# Patient Record
Sex: Female | Born: 1935 | Race: White | Hispanic: No | Marital: Single | Smoking: Never smoker
Health system: Southern US, Community
[De-identification: ages and names within clinical notes are randomized; demographics above are authoritative.]

## PROBLEM LIST (undated history)

## (undated) DIAGNOSIS — I10 Essential (primary) hypertension: Secondary | ICD-10-CM

## (undated) DIAGNOSIS — E785 Hyperlipidemia, unspecified: Secondary | ICD-10-CM

## (undated) HISTORY — DX: Hyperlipidemia, unspecified: E78.5

## (undated) HISTORY — PX: OTHER SURGICAL HISTORY: SHX169

## (undated) HISTORY — PX: GALLBLADDER SURGERY: SHX652

## (undated) HISTORY — DX: Essential (primary) hypertension: I10

---

## 2011-06-06 ENCOUNTER — Ambulatory Visit: Payer: Medicare Other | Attending: Orthopedic Surgery | Admitting: Physical Therapy

## 2011-08-24 ENCOUNTER — Encounter (INDEPENDENT_AMBULATORY_CARE_PROVIDER_SITE_OTHER): Payer: Self-pay | Admitting: *Deleted

## 2012-11-26 ENCOUNTER — Other Ambulatory Visit (HOSPITAL_COMMUNITY): Payer: Self-pay

## 2012-12-31 ENCOUNTER — Encounter: Payer: Self-pay | Admitting: Cardiology

## 2013-01-02 ENCOUNTER — Encounter: Payer: Self-pay | Admitting: Cardiovascular Disease

## 2013-01-02 ENCOUNTER — Other Ambulatory Visit: Payer: Self-pay | Admitting: *Deleted

## 2013-01-02 ENCOUNTER — Encounter: Payer: Self-pay | Admitting: *Deleted

## 2013-01-02 ENCOUNTER — Ambulatory Visit: Payer: Self-pay | Admitting: Cardiovascular Disease

## 2013-01-02 ENCOUNTER — Ambulatory Visit (INDEPENDENT_AMBULATORY_CARE_PROVIDER_SITE_OTHER): Payer: Medicare Other | Admitting: Cardiovascular Disease

## 2013-01-02 VITALS — BP 125/82 | HR 100 | Ht 67.0 in | Wt 191.0 lb

## 2013-01-02 DIAGNOSIS — R011 Cardiac murmur, unspecified: Secondary | ICD-10-CM

## 2013-01-02 DIAGNOSIS — I10 Essential (primary) hypertension: Secondary | ICD-10-CM | POA: Insufficient documentation

## 2013-01-02 DIAGNOSIS — M542 Cervicalgia: Secondary | ICD-10-CM

## 2013-01-02 DIAGNOSIS — R0609 Other forms of dyspnea: Secondary | ICD-10-CM

## 2013-01-02 DIAGNOSIS — Z136 Encounter for screening for cardiovascular disorders: Secondary | ICD-10-CM

## 2013-01-02 DIAGNOSIS — R079 Chest pain, unspecified: Secondary | ICD-10-CM

## 2013-01-02 DIAGNOSIS — E785 Hyperlipidemia, unspecified: Secondary | ICD-10-CM

## 2013-01-02 NOTE — Progress Notes (Signed)
Patient ID: Nina Lowery, female   DOB: 1935/12/03, 77 y.o.   MRN: [id number]       CARDIOLOGY CONSULT NOTE  Patient ID: Nina Lowery MRN: [id number] DOB/AGE: 09/21/1935 77 y.o.  Admit date: (Not on file) Primary Physician Josue Hector, MD  Reason for Consultation: doe, murmur  HPI: [name] is a 77 year old woman with a PMH significant for HTN, who has been experiencing dyspnea on exertion, especially when walking uphill. This has been going on for 1-2 years.  It can occur when walking from the parking lot of a store to the front door. She denies chest pain, but has an associated aching sensation in her neck and across both shoulders. She denies associated diaphoresis and nausea, along with lightheadedness, dizziness, and syncope. It lasts 15-20 minutes and if she sits down and rests, it subsides. She's never taken any medication for it. She has ankle swelling and takes HCTZ, but she says it's never been bothersome. It may occur 1-2 times a month.  She also has a h/o a cardiac murmur. She also has a h/o anemia and was found to have heme-positive stool. A recent Hgb was 13.3.  She denies ever having had an echocardiogram or having seen a cardiologist.   SocHx: neither drinks nor smokes. Quit smoking 50 years ago.  FamHx: mother died of an MI at age 72. Brother died of an MI in his late 72's, and a sister died of an MI in her 10's. Father died of an MI at 34.    Allergies  Allergen Reactions  . Shrimp [Shellfish Allergy]     Current Outpatient Prescriptions  Medication Sig Dispense Refill  . acetaminophen (TYLENOL) 500 MG tablet Take 500 mg by mouth every 6 (six) hours as needed for pain.      . busPIRone (BUSPAR) 15 MG tablet Take 15 mg by mouth 2 (two) times daily.      . celecoxib (CELEBREX) 200 MG capsule Take 200 mg by mouth 2 (two) times daily as needed for pain.      . cholecalciferol (VITAMIN D) 400 UNITS TABS tablet Take 400 Units by mouth daily.       . ferrous sulfate 325 (65 FE) MG tablet Take 325 mg by mouth daily with breakfast.      . fosinopril (MONOPRIL) 40 MG tablet Take 40 mg by mouth daily.      . hydrochlorothiazide (HYDRODIURIL) 25 MG tablet Take 25 mg by mouth daily.      Marland Kitchen HYDROcodone-acetaminophen (NORCO/VICODIN) 5-325 MG per tablet Take 1 tablet by mouth every 6 (six) hours as needed for pain.      Marland Kitchen imipramine (TOFRANIL) 50 MG tablet Take 50 mg by mouth at bedtime.       No current facility-administered medications for this visit.    PMH: HTN  Past Surgical History  Procedure Laterality Date  . Hysterectomy --- unknown    . Gallbladder surgery      History   Social History  . Marital Status: Single    Spouse Name: N/A    Number of Children: N/A  . Years of Education: N/A   Occupational History  . Not on file.   Social History Main Topics  . Smoking status: Never Smoker   . Smokeless tobacco: Not on file  . Alcohol Use: Not on file  . Drug Use: Not on file  . Sexual Activity: Not on file   Other Topics Concern  . Not on file  Social History Narrative  . No narrative on file     No family history on file.   Prior to Admission medications   Medication Sig Start Date End Date Taking? Authorizing Provider  acetaminophen (TYLENOL) 500 MG tablet Take 500 mg by mouth every 6 (six) hours as needed for pain.   Yes Historical Provider, MD  busPIRone (BUSPAR) 15 MG tablet Take 15 mg by mouth 2 (two) times daily.   Yes Historical Provider, MD  celecoxib (CELEBREX) 200 MG capsule Take 200 mg by mouth 2 (two) times daily as needed for pain.   Yes Historical Provider, MD  cholecalciferol (VITAMIN D) 400 UNITS TABS tablet Take 400 Units by mouth daily.   Yes Historical Provider, MD  ferrous sulfate 325 (65 FE) MG tablet Take 325 mg by mouth daily with breakfast.   Yes Historical Provider, MD  fosinopril (MONOPRIL) 40 MG tablet Take 40 mg by mouth daily.   Yes Historical Provider, MD  hydrochlorothiazide  (HYDRODIURIL) 25 MG tablet Take 25 mg by mouth daily.   Yes Historical Provider, MD  HYDROcodone-acetaminophen (NORCO/VICODIN) 5-325 MG per tablet Take 1 tablet by mouth every 6 (six) hours as needed for pain.   Yes Historical Provider, MD  imipramine (TOFRANIL) 50 MG tablet Take 50 mg by mouth at bedtime.   Yes Historical Provider, MD     Review of systems complete and found to be negative unless listed above in HPI     Physical exam Blood pressure 125/82, pulse 100, height 5\' 7"  (1.702 m), weight 191 lb (86.637 kg). General: NAD Neck: No JVD, no thyromegaly or thyroid nodule.  Lungs: Faint dry intermittent crackles bilaterally at bases with normal respiratory effort. CV: Nondisplaced PMI.  Heart regular S1/slightly diminished S2, no S3/S4, III-IV/VI harsh systolic murmur best heard at RUSB.  No peripheral edema.  No carotid bruit.  Normal pedal pulses.  Abdomen: Soft, nontender, no hepatosplenomegaly, no distention.  Skin: Intact without lesions or rashes.  Neurologic: Alert and oriented x 3.  Psych: Normal affect. Extremities: No clubbing or cyanosis.  HEENT: Normal.   Labs:   No results found for this basename: WBC, HGB, HCT, MCV, PLT   No results found for this basename: NA, K, CL, CO2, BUN, CREATININE, CALCIUM, LABALBU, PROT, BILITOT, ALKPHOS, ALT, AST, GLUCOSE,  in the last 168 hours No results found for this basename: CKTOTAL, CKMB, CKMBINDEX, TROPONINI    No results found for this basename: CHOL   No results found for this basename: HDL   No results found for this basename: LDLCALC   No results found for this basename: TRIG   No results found for this basename: CHOLHDL   No results found for this basename: LDLDIRECT       EKG: Sinus rhythm, rate 100 bpm, LVH with repolarization changes  Studies: No results found.  ASSESSMENT: 1. Dyspnea on exertion with associated neck and shoulder aching 2. Cardiac murmur 3. Strong family h/o CAD 4. ECG with  LVH  PLAN: Given the length of each episode which occurs with exertion, in the context of a significant family h/o CAD, her symptoms are concerning for occult ischemia (although a stenotic valve could also be contributing). I will obtain a Lexiscan Cardiolite stress test. To further evaluate her murmur, I will obtain an echocardiogram to assess for valvular pathology (aortic stenosis in particular). I will also evaluate for structural heart disease (e.g. LVH) and both systolic and diastolic function. Her HTN is controlled on current therapy. She tells  me her lipids are normal. No medication adjustments at this time.  Dispo: will f/u in 2-3 weeks after testing is complete.   Signed: Prentice Docker, M.D., F.A.C.C.  01/02/2013, 9:23 AM

## 2013-01-02 NOTE — Patient Instructions (Signed)
Your physician has requested that you have an echocardiogram. Echocardiography is a painless test that uses sound waves to create images of your heart. It provides your doctor with information about the size and shape of your heart and how well your heart's chambers and valves are working. This procedure takes approximately one hour. There are no restrictions for this procedure. Your physician has requested that you have a lexiscan myoview. For further information please visit [email or web address] Please follow instruction sheet, as given. Office will contact with results via phone or letter.    Continue all current medications. Follow up in  2-4 weeks

## 2013-01-14 ENCOUNTER — Other Ambulatory Visit: Payer: Self-pay | Admitting: *Deleted

## 2013-01-14 DIAGNOSIS — R079 Chest pain, unspecified: Secondary | ICD-10-CM

## 2013-01-15 ENCOUNTER — Other Ambulatory Visit: Payer: Medicare Other

## 2013-01-23 ENCOUNTER — Other Ambulatory Visit: Payer: Medicare Other

## 2013-01-30 ENCOUNTER — Other Ambulatory Visit: Payer: Medicare Other

## 2013-02-13 ENCOUNTER — Other Ambulatory Visit: Payer: Medicare Other

## 2013-03-06 ENCOUNTER — Encounter: Payer: Self-pay | Admitting: Cardiovascular Disease

## 2013-04-01 ENCOUNTER — Telehealth: Payer: Self-pay | Admitting: *Deleted

## 2013-04-01 NOTE — Telephone Encounter (Signed)
Patient last seen on 01/02/2013 by Dr. Purvis Sheffield.  Echo & Lexiscan Cardiolite test were ordered.  These test have not been completed yet.    Echo - 10/15 - cx due to car trouble Echo - 10/23 - cx due to woke up with flu symptoms Echo - 10/30 - cx due to pt has to have someone bring her  Echo - 11/13 - cx due to pt being sick & will call back when get daughters schedule   Lexiscan Cardiolite - 10/16 - cx - no reason

## 2013-04-01 NOTE — Telephone Encounter (Signed)
Left message to return call 

## 2013-04-08 ENCOUNTER — Encounter: Payer: Self-pay | Admitting: *Deleted

## 2013-04-08 NOTE — Telephone Encounter (Signed)
No reply from patient yet, will mail letter today.

## 2013-04-28 ENCOUNTER — Telehealth: Payer: Self-pay | Admitting: *Deleted

## 2013-04-28 NOTE — Telephone Encounter (Signed)
Message copied by Lesle ChrisHILL, ANGELA G on Mon Apr 28, 2013 10:51 AM ------      Message from: Lesle ChrisHILL, ANGELA G      Created: Tue Apr 08, 2013 11:13 AM      Regarding: ko fu       Mailed no show letter today, will give 2 weeks to respond.  Today - 04/08/2013  ------

## 2013-04-28 NOTE — Telephone Encounter (Signed)
No reply from patient to date.

## 2013-07-17 ENCOUNTER — Telehealth: Payer: Self-pay | Admitting: Cardiovascular Disease

## 2013-07-17 NOTE — Telephone Encounter (Signed)
Left message to have patient call to make an appointment with us.  Dr Joyce CopaNyland's office faxed a request for us to see patient.

## 2015-08-09 ENCOUNTER — Encounter (INDEPENDENT_AMBULATORY_CARE_PROVIDER_SITE_OTHER): Payer: Self-pay | Admitting: Ophthalmology

## 2015-08-23 ENCOUNTER — Encounter (INDEPENDENT_AMBULATORY_CARE_PROVIDER_SITE_OTHER): Payer: Medicare Other | Admitting: Ophthalmology

## 2015-08-23 DIAGNOSIS — H43813 Vitreous degeneration, bilateral: Secondary | ICD-10-CM | POA: Diagnosis not present

## 2015-08-23 DIAGNOSIS — H35033 Hypertensive retinopathy, bilateral: Secondary | ICD-10-CM

## 2015-08-23 DIAGNOSIS — H353132 Nonexudative age-related macular degeneration, bilateral, intermediate dry stage: Secondary | ICD-10-CM | POA: Diagnosis not present

## 2015-08-23 DIAGNOSIS — H2513 Age-related nuclear cataract, bilateral: Secondary | ICD-10-CM | POA: Diagnosis not present

## 2015-08-23 DIAGNOSIS — I10 Essential (primary) hypertension: Secondary | ICD-10-CM | POA: Diagnosis not present

## 2017-12-13 ENCOUNTER — Telehealth: Payer: Self-pay

## 2017-12-13 NOTE — Telephone Encounter (Signed)
Notes on file, referral sent to scheduling.  

## 2018-01-28 ENCOUNTER — Ambulatory Visit: Payer: Medicare Other | Admitting: Cardiovascular Disease

## 2018-01-31 ENCOUNTER — Ambulatory Visit: Payer: Medicare Other | Admitting: Cardiovascular Disease

## 2018-02-07 ENCOUNTER — Emergency Department (HOSPITAL_COMMUNITY): Payer: Medicare Other

## 2018-02-07 ENCOUNTER — Inpatient Hospital Stay (HOSPITAL_COMMUNITY)
Admission: EM | Admit: 2018-02-07 | Discharge: 2018-03-03 | DRG: 871 | Disposition: E | Payer: Medicare Other | Attending: Internal Medicine | Admitting: Internal Medicine

## 2018-02-07 ENCOUNTER — Encounter (HOSPITAL_COMMUNITY): Payer: Self-pay

## 2018-02-07 ENCOUNTER — Other Ambulatory Visit: Payer: Self-pay

## 2018-02-07 DIAGNOSIS — I110 Hypertensive heart disease with heart failure: Secondary | ICD-10-CM | POA: Diagnosis present

## 2018-02-07 DIAGNOSIS — E8729 Other acidosis: Secondary | ICD-10-CM | POA: Diagnosis present

## 2018-02-07 DIAGNOSIS — R230 Cyanosis: Secondary | ICD-10-CM | POA: Diagnosis present

## 2018-02-07 DIAGNOSIS — D72829 Elevated white blood cell count, unspecified: Secondary | ICD-10-CM | POA: Diagnosis not present

## 2018-02-07 DIAGNOSIS — Z515 Encounter for palliative care: Secondary | ICD-10-CM | POA: Diagnosis not present

## 2018-02-07 DIAGNOSIS — E872 Acidosis, unspecified: Secondary | ICD-10-CM

## 2018-02-07 DIAGNOSIS — R7989 Other specified abnormal findings of blood chemistry: Secondary | ICD-10-CM | POA: Diagnosis not present

## 2018-02-07 DIAGNOSIS — M79605 Pain in left leg: Secondary | ICD-10-CM

## 2018-02-07 DIAGNOSIS — I4891 Unspecified atrial fibrillation: Secondary | ICD-10-CM | POA: Diagnosis not present

## 2018-02-07 DIAGNOSIS — D65 Disseminated intravascular coagulation [defibrination syndrome]: Secondary | ICD-10-CM | POA: Diagnosis present

## 2018-02-07 DIAGNOSIS — R0609 Other forms of dyspnea: Secondary | ICD-10-CM | POA: Diagnosis not present

## 2018-02-07 DIAGNOSIS — A419 Sepsis, unspecified organism: Principal | ICD-10-CM | POA: Diagnosis present

## 2018-02-07 DIAGNOSIS — R6520 Severe sepsis without septic shock: Secondary | ICD-10-CM

## 2018-02-07 DIAGNOSIS — I5033 Acute on chronic diastolic (congestive) heart failure: Secondary | ICD-10-CM | POA: Diagnosis present

## 2018-02-07 DIAGNOSIS — R740 Nonspecific elevation of levels of transaminase and lactic acid dehydrogenase [LDH]: Secondary | ICD-10-CM

## 2018-02-07 DIAGNOSIS — G9341 Metabolic encephalopathy: Secondary | ICD-10-CM

## 2018-02-07 DIAGNOSIS — N179 Acute kidney failure, unspecified: Secondary | ICD-10-CM | POA: Diagnosis present

## 2018-02-07 DIAGNOSIS — J9601 Acute respiratory failure with hypoxia: Secondary | ICD-10-CM | POA: Diagnosis present

## 2018-02-07 DIAGNOSIS — I159 Secondary hypertension, unspecified: Secondary | ICD-10-CM

## 2018-02-07 DIAGNOSIS — E869 Volume depletion, unspecified: Secondary | ICD-10-CM | POA: Diagnosis present

## 2018-02-07 DIAGNOSIS — L8990 Pressure ulcer of unspecified site, unspecified stage: Secondary | ICD-10-CM

## 2018-02-07 DIAGNOSIS — Z91013 Allergy to seafood: Secondary | ICD-10-CM

## 2018-02-07 DIAGNOSIS — I10 Essential (primary) hypertension: Secondary | ICD-10-CM | POA: Diagnosis present

## 2018-02-07 DIAGNOSIS — M79606 Pain in leg, unspecified: Secondary | ICD-10-CM

## 2018-02-07 DIAGNOSIS — R7401 Elevation of levels of liver transaminase levels: Secondary | ICD-10-CM

## 2018-02-07 DIAGNOSIS — J690 Pneumonitis due to inhalation of food and vomit: Secondary | ICD-10-CM | POA: Diagnosis present

## 2018-02-07 DIAGNOSIS — R0600 Dyspnea, unspecified: Secondary | ICD-10-CM

## 2018-02-07 DIAGNOSIS — R6521 Severe sepsis with septic shock: Secondary | ICD-10-CM | POA: Diagnosis present

## 2018-02-07 DIAGNOSIS — E871 Hypo-osmolality and hyponatremia: Secondary | ICD-10-CM | POA: Diagnosis present

## 2018-02-07 DIAGNOSIS — E785 Hyperlipidemia, unspecified: Secondary | ICD-10-CM | POA: Diagnosis present

## 2018-02-07 DIAGNOSIS — I350 Nonrheumatic aortic (valve) stenosis: Secondary | ICD-10-CM | POA: Diagnosis present

## 2018-02-07 DIAGNOSIS — R748 Abnormal levels of other serum enzymes: Secondary | ICD-10-CM | POA: Diagnosis present

## 2018-02-07 DIAGNOSIS — I4819 Other persistent atrial fibrillation: Secondary | ICD-10-CM | POA: Diagnosis present

## 2018-02-07 DIAGNOSIS — Z66 Do not resuscitate: Secondary | ICD-10-CM | POA: Diagnosis not present

## 2018-02-07 DIAGNOSIS — Z7189 Other specified counseling: Secondary | ICD-10-CM

## 2018-02-07 DIAGNOSIS — K7200 Acute and subacute hepatic failure without coma: Secondary | ICD-10-CM | POA: Diagnosis present

## 2018-02-07 DIAGNOSIS — M79604 Pain in right leg: Secondary | ICD-10-CM

## 2018-02-07 DIAGNOSIS — R4182 Altered mental status, unspecified: Secondary | ICD-10-CM | POA: Diagnosis present

## 2018-02-07 LAB — COMPREHENSIVE METABOLIC PANEL
ALBUMIN: 3 g/dL — AB (ref 3.5–5.0)
ALT: 74 U/L — AB (ref 0–44)
AST: 133 U/L — AB (ref 15–41)
Alkaline Phosphatase: 105 U/L (ref 38–126)
Anion gap: >20 — ABNORMAL HIGH (ref 5–15)
BILIRUBIN TOTAL: 4.5 mg/dL — AB (ref 0.3–1.2)
BUN: 69 mg/dL — ABNORMAL HIGH (ref 8–23)
CO2: 14 mmol/L — ABNORMAL LOW (ref 22–32)
CREATININE: 2.92 mg/dL — AB (ref 0.44–1.00)
Calcium: 8.5 mg/dL — ABNORMAL LOW (ref 8.9–10.3)
Chloride: 93 mmol/L — ABNORMAL LOW (ref 98–111)
GFR calc Af Amer: 16 mL/min — ABNORMAL LOW (ref >60)
GFR, EST NON AFRICAN AMERICAN: 14 mL/min — AB (ref >60)
GLUCOSE: 101 mg/dL — AB (ref 70–99)
POTASSIUM: 4.4 mmol/L (ref 3.5–5.1)
Sodium: 128 mmol/L — ABNORMAL LOW (ref 135–145)
TOTAL PROTEIN: 6.7 g/dL (ref 6.5–8.1)

## 2018-02-07 LAB — CBC WITH DIFFERENTIAL/PLATELET
Abs Immature Granulocytes: 0.07 10*3/uL (ref 0.00–0.07)
BASOS ABS: 0 10*3/uL (ref 0.0–0.1)
BASOS PCT: 0 %
EOS PCT: 0 %
Eosinophils Absolute: 0 10*3/uL (ref 0.0–0.5)
HCT: 41.4 % (ref 36.0–46.0)
Hemoglobin: 12.5 g/dL (ref 12.0–15.0)
IMMATURE GRANULOCYTES: 1 %
LYMPHS PCT: 7 %
Lymphs Abs: 1 10*3/uL (ref 0.7–4.0)
MCH: 25.8 pg — ABNORMAL LOW (ref 26.0–34.0)
MCHC: 30.2 g/dL (ref 30.0–36.0)
MCV: 85.4 fL (ref 80.0–100.0)
MONO ABS: 1.4 10*3/uL — AB (ref 0.1–1.0)
MONOS PCT: 10 %
NEUTROS ABS: 11.8 10*3/uL — AB (ref 1.7–7.7)
Neutrophils Relative %: 82 %
PLATELETS: 340 10*3/uL (ref 150–400)
RBC: 4.85 MIL/uL (ref 3.87–5.11)
RDW: 17 % — AB (ref 11.5–15.5)
WBC: 14.3 10*3/uL — ABNORMAL HIGH (ref 4.0–10.5)
nRBC: 0.2 % (ref 0.0–0.2)

## 2018-02-07 LAB — URINALYSIS, ROUTINE W REFLEX MICROSCOPIC
BILIRUBIN URINE: NEGATIVE
GLUCOSE, UA: NEGATIVE mg/dL
Hgb urine dipstick: NEGATIVE
Ketones, ur: NEGATIVE mg/dL
LEUKOCYTES UA: NEGATIVE
NITRITE: NEGATIVE
PH: 5 (ref 5.0–8.0)
Protein, ur: 30 mg/dL — AB
SPECIFIC GRAVITY, URINE: 1.019 (ref 1.005–1.030)

## 2018-02-07 LAB — PROCALCITONIN: PROCALCITONIN: 0.35 ng/mL

## 2018-02-07 LAB — LACTIC ACID, PLASMA: Lactic Acid, Venous: 2.3 mmol/L (ref 0.5–1.9)

## 2018-02-07 LAB — INFLUENZA PANEL BY PCR (TYPE A & B)
Influenza A By PCR: NEGATIVE
Influenza B By PCR: NEGATIVE

## 2018-02-07 LAB — I-STAT CG4 LACTIC ACID, ED: LACTIC ACID, VENOUS: 8.97 mmol/L — AB (ref 0.5–1.9)

## 2018-02-07 LAB — MAGNESIUM: Magnesium: 2.5 mg/dL — ABNORMAL HIGH (ref 1.7–2.4)

## 2018-02-07 LAB — PROTIME-INR
INR: 2.63
PROTHROMBIN TIME: 27.7 s — AB (ref 11.4–15.2)

## 2018-02-07 LAB — MRSA PCR SCREENING: MRSA by PCR: NEGATIVE

## 2018-02-07 LAB — BRAIN NATRIURETIC PEPTIDE: B Natriuretic Peptide: 288 pg/mL — ABNORMAL HIGH (ref 0.0–100.0)

## 2018-02-07 LAB — TSH: TSH: 1.044 u[IU]/mL (ref 0.350–4.500)

## 2018-02-07 LAB — TROPONIN I: Troponin I: 0.05 ng/mL (ref <0.03)

## 2018-02-07 MED ORDER — ENOXAPARIN SODIUM 40 MG/0.4ML ~~LOC~~ SOLN: 40.0000 mg | SUBCUTANEOUS | Status: DC

## 2018-02-07 MED ORDER — SODIUM CHLORIDE 0.9 % IV BOLUS (SEPSIS)
1000.0000 mL | Freq: Once | INTRAVENOUS | Status: AC
  Administered 2018-02-07: 1000 mL via INTRAVENOUS

## 2018-02-07 MED ORDER — VANCOMYCIN HCL IN DEXTROSE 1-5 GM/200ML-% IV SOLN
1000.0000 mg | Freq: Once | INTRAVENOUS | Status: AC
  Administered 2018-02-07: 1000 mg via INTRAVENOUS
  Filled 2018-02-07: qty 200

## 2018-02-07 MED ORDER — SODIUM CHLORIDE 0.9 % IV SOLN
2.0000 g | Freq: Once | INTRAVENOUS | Status: AC
  Administered 2018-02-07: 2 g via INTRAVENOUS
  Filled 2018-02-07: qty 2

## 2018-02-07 MED ORDER — AMIODARONE HCL IN DEXTROSE 360-4.14 MG/200ML-% IV SOLN
30.0000 mg/h | INTRAVENOUS | Status: DC
  Administered 2018-02-08 (×2): 30 mg/h via INTRAVENOUS
  Filled 2018-02-07 (×2): qty 200

## 2018-02-07 MED ORDER — AMIODARONE HCL IN DEXTROSE 360-4.14 MG/200ML-% IV SOLN
60.0000 mg/h | INTRAVENOUS | Status: AC
  Administered 2018-02-07 (×2): 60 mg/h via INTRAVENOUS
  Filled 2018-02-07 (×2): qty 200

## 2018-02-07 MED ORDER — NYSTATIN 100000 UNIT/GM EX POWD
Freq: Three times a day (TID) | CUTANEOUS | Status: DC
  Administered 2018-02-08: 02:00:00 via TOPICAL
  Filled 2018-02-07: qty 15

## 2018-02-07 MED ORDER — SODIUM CHLORIDE 0.9 % IV SOLN
1.0000 g | INTRAVENOUS | Status: DC
  Filled 2018-02-07 (×4): qty 1

## 2018-02-07 MED ORDER — METRONIDAZOLE IN NACL 5-0.79 MG/ML-% IV SOLN
500.0000 mg | Freq: Three times a day (TID) | INTRAVENOUS | Status: DC
  Administered 2018-02-07 – 2018-02-08 (×3): 500 mg via INTRAVENOUS
  Filled 2018-02-07 (×3): qty 100

## 2018-02-07 MED ORDER — VANCOMYCIN HCL IN DEXTROSE 1-5 GM/200ML-% IV SOLN: 1000.0000 mg | INTRAVENOUS | Status: DC

## 2018-02-07 MED ORDER — MIDODRINE HCL 5 MG PO TABS
10.0000 mg | ORAL_TABLET | Freq: Three times a day (TID) | ORAL | Status: DC
  Filled 2018-02-07: qty 2

## 2018-02-07 MED ORDER — SODIUM CHLORIDE 0.9 % IV SOLN
INTRAVENOUS | Status: DC | PRN
  Administered 2018-02-07: 500 mL via INTRAVENOUS

## 2018-02-07 MED ORDER — INFLUENZA VAC SPLIT HIGH-DOSE 0.5 ML IM SUSY
0.5000 mL | PREFILLED_SYRINGE | INTRAMUSCULAR | Status: DC
  Filled 2018-02-07: qty 0.5

## 2018-02-07 MED ORDER — AMIODARONE LOAD VIA INFUSION
150.0000 mg | Freq: Once | INTRAVENOUS | Status: AC
  Administered 2018-02-07: 150 mg via INTRAVENOUS
  Filled 2018-02-07: qty 83.34

## 2018-02-07 MED ORDER — MIDODRINE HCL 5 MG PO TABS
10.0000 mg | ORAL_TABLET | Freq: Three times a day (TID) | ORAL | Status: DC
Start: 2018-02-07 — End: 2018-02-08
  Administered 2018-02-07: 10 mg via ORAL

## 2018-02-07 NOTE — ED Notes (Addendum)
CRITICAL VALUE ALERT  Critical Value:  Lactic Acid 2.3  Date & Time Notied:  18-Feb-2018 1726  Provider Notified: Dr. Jeraldine Loots   Orders Received/Actions taken: None

## 2018-02-07 NOTE — ED Notes (Signed)
Per Dr. Jeraldine Loots, he stated he would not be contacting the Critical Care Doctor. Is consulting cardiology for admission

## 2018-02-07 NOTE — Progress Notes (Signed)
Pharmacy: Contacted MD, Heparin will not be started at this time due to elevated INR at baseline.  Further anticoag plans pending further evaluation during this admission.  Mady Gemma, [facility] 2018/02/14 8:37 PM

## 2018-02-07 NOTE — ED Notes (Signed)
Have paged ac for Amiodarone

## 2018-02-07 NOTE — ED Provider Notes (Signed)
[facility] EMERGENCY DEPARTMENT Provider Note   CSN: [id number] Arrival date & time: 02/11/18  1215     History   Chief Complaint Chief Complaint  Patient presents with  . Altered Mental Status    HPI Nina Lowery is a 82 y.o. female.  HPI Patient presents after being found by her daughter on the ground.   EMS reports the patient is altered on arrival, but improved in route with provision of supplemental oxygen and fluids. Patient herself is awake and alert, cannot specify how long she has been ill, but states that she has been feeling poorly for some time, feels generally weak, sore, without focal pain, without focal weakness.  She describes some dyspnea, denies fever. Is unclear if there is clear precipitant, and patient denies any clear alleviating or exacerbating factors. EMS notes the patient was hypoxic, hypotensive, and tachycardic on arrival, tachycardia improved somewhat with fluids. Level 5 caveat secondary to acuity of condition. Past Medical History:  Diagnosis Date  . HTN (hypertension)   . Hyperlipemia     Patient Active Problem List   Diagnosis Date Noted  . Dyspnea on exertion 01/02/2013  . HTN (hypertension) 01/02/2013  . Neck pain, bilateral 01/02/2013    Past Surgical History:  Procedure Laterality Date  . GALLBLADDER SURGERY    . hysterectomy --- unknown       OB History   None      Home Medications    Prior to Admission medications   Medication Sig Start Date End Date Taking? Authorizing Provider  fosinopril (MONOPRIL) 40 MG tablet Take 40 mg by mouth daily.   Yes [provider]  furosemide (LASIX) 40 MG tablet Take 40 mg by mouth daily.   Yes [provider]  imipramine (TOFRANIL) 50 MG tablet Take 50 mg by mouth at bedtime.   Yes [provider]  potassium chloride SA (K-DUR,KLOR-CON) 20 MEQ tablet Take 20 mEq by mouth daily.   Yes [provider]  acetaminophen (TYLENOL) 500 MG tablet  Take 500 mg by mouth every 6 (six) hours as needed for pain.    [provider]  busPIRone (BUSPAR) 15 MG tablet Take 15 mg by mouth 2 (two) times daily.    [provider]  celecoxib (CELEBREX) 200 MG capsule Take 200 mg by mouth 2 (two) times daily as needed for pain.    [provider]  cholecalciferol (VITAMIN D) 400 UNITS TABS tablet Take 400 Units by mouth daily.    [provider]  ferrous sulfate 325 (65 FE) MG tablet Take 325 mg by mouth daily with breakfast.    [provider]  hydrochlorothiazide (HYDRODIURIL) 25 MG tablet Take 25 mg by mouth daily.    [provider]  HYDROcodone-acetaminophen (NORCO/VICODIN) 5-325 MG per tablet Take 1 tablet by mouth every 6 (six) hours as needed for pain.    [provider]    Family History History reviewed. No pertinent family history.  Social History Social History   Tobacco Use  . Smoking status: Never Smoker  Substance Use Topics  . Alcohol use: Not on file  . Drug use: Not on file     Allergies   Shrimp [shellfish allergy]   Review of Systems Review of Systems  Unable to perform ROS: Acuity of condition     Physical Exam Updated Vital Signs BP 95/79   Pulse (!) 144   Temp (!) 96.8 F (36 C) (Rectal)   Resp 20  Ht 5' 7.5" (1.715 m)   Wt 86.6 kg   SpO2 100%   BMI 29.47 kg/m   Physical Exam  Constitutional: She is oriented to person, place, and time. She has a sickly appearance. She appears ill. She appears distressed.  HENT:  Head: Normocephalic and atraumatic.  Eyes: Conjunctivae and EOM are normal.  Cardiovascular: An irregularly irregular rhythm present. Tachycardia present.  Pulmonary/Chest: No stridor. Tachypnea noted. She has decreased breath sounds. She has no wheezes.  Abdominal: She exhibits no distension.  Musculoskeletal: She exhibits no edema.  Neurological: She is alert and oriented to person, place, and time.  Patient is oriented x3,  moves all extremities spontaneously, but is withdrawn, full neuro exam not complete  Skin: Skin is warm and dry.  Psychiatric: Her speech is delayed. She is slowed and withdrawn.  Nursing note and vitals reviewed.    ED Treatments / Results  Labs (all labs ordered are listed, but only abnormal results are displayed) Labs Reviewed  COMPREHENSIVE METABOLIC PANEL - Abnormal; Notable for the following components:      Result Value   Sodium 128 (*)    Chloride 93 (*)    CO2 14 (*)    Glucose, Bld 101 (*)    BUN 69 (*)    Creatinine, Ser 2.92 (*)    Calcium 8.5 (*)    Albumin 3.0 (*)    AST 133 (*)    ALT 74 (*)    Total Bilirubin 4.5 (*)    GFR calc non Af Amer 14 (*)    GFR calc Af Amer 16 (*)    Anion gap >20 (*)    All other components within normal limits  CBC WITH DIFFERENTIAL/PLATELET - Abnormal; Notable for the following components:   WBC 14.3 (*)    MCH 25.8 (*)    RDW 17.0 (*)    Neutro Abs 11.8 (*)    Monocytes Absolute 1.4 (*)    All other components within normal limits  PROTIME-INR - Abnormal; Notable for the following components:   Prothrombin Time 27.7 (*)    All other components within normal limits  URINALYSIS, ROUTINE W REFLEX MICROSCOPIC - Abnormal; Notable for the following components:   Color, Urine AMBER (*)    APPearance HAZY (*)    Protein, ur 30 (*)    Bacteria, UA RARE (*)    All other components within normal limits  LACTIC ACID, PLASMA - Abnormal; Notable for the following components:   Lactic Acid, Venous 2.3 (*)    All other components within normal limits  BRAIN NATRIURETIC PEPTIDE - Abnormal; Notable for the following components:   B Natriuretic Peptide 288.0 (*)    All other components within normal limits  I-STAT CG4 LACTIC ACID, ED - Abnormal; Notable for the following components:   Lactic Acid, Venous 8.97 (*)    All other components within normal limits  CULTURE, BLOOD (ROUTINE X 2)  CULTURE, BLOOD (ROUTINE X 2)  URINE CULTURE    INFLUENZA PANEL BY PCR (TYPE A & B)  CREATININE, SERUM  I-STAT CG4 LACTIC ACID, ED    EKG Atrial fibrillation with rapid ventricular response, rate 150, abnormal  Repeat ECG  EKG Interpretation  Date/Time:  Thursday February 24, 2018 18:10:07 EST Ventricular Rate:  143 PR Interval:    QRS Duration: 146 QT Interval:  329 QTC Calculation: 508 R Axis:   115 Text Interpretation:  Atrial fibrillation with rapid ventricular response Abnormal ekg Confirmed by Gerhard Munch [phone])  on 03/01/2018 6:15:03 PM         Radiology Ct Chest Wo Contrast  Result Date: 02/02/2018 CLINICAL DATA:  Chronic shortness of breath, pleurisy, difficulty breathing EXAM: CT CHEST WITHOUT CONTRAST TECHNIQUE: Multidetector CT imaging of the chest was performed following the standard protocol without IV contrast. COMPARISON:  Portable chest x-ray of 02/07/2017 FINDINGS: Cardiovascular: The heart is mildly enlarged and there are diffuse coronary artery calcifications present. No pericardial effusion is seen. The mid ascending thoracic aorta measures 39 mm in diameter. Mediastinum/Nodes: No mediastinal or hilar adenopathy is seen. The thyroid gland is unremarkable although not optimally visualized. The does appear to be a moderate size hiatal hernia present. Lungs/Pleura: There are bilateral pleural effusions present. Opacities are noted in both lower lobes left greater than right with air bronchograms. Some of these opacities could be due to atelectasis, but pneumonia particularly in the left lower lobe is a definite consideration. There is some debris within the bronchi to the lower lobes and aspiration would be a consideration. Upper Abdomen: On images through the upper abdomen, no significant abnormality is seen. There may be some enlargement of the left adrenal gland which is not completely imaged. Musculoskeletal: The thoracic vertebrae are in normal alignment with only mild degenerative change. Some motion does  obscure evaluation of several areas, such as the sternum on the lateral view. IMPRESSION: 1. Bilateral pleural effusions right greater than left with bilateral lower lobe atelectasis and/or pneumonia left-greater-than-right. Cannot exclude aspiration. 2. Moderate size hiatal hernia. 3. Cardiomegaly and diffuse coronary artery calcifications. Electronically Signed   By: Dwyane Dee M.D.   On: 02/06/2018 16:41   Dg Chest Port 1 View  Result Date: 02/11/2018 CLINICAL DATA:  Respiratory distress. EXAM: PORTABLE CHEST 1 VIEW COMPARISON:  None. FINDINGS: Mild cardiomegaly is noted. No pneumothorax is noted. Mild right pleural effusion is noted with probable underlying atelectasis. Mild left basilar atelectasis may be present. Bony thorax is unremarkable. IMPRESSION: Mild right pleural effusion with probable underlying atelectasis. Probable mild left basilar atelectasis. Electronically Signed   By: Lupita Raider, M.D.   On: 02/18/2018 13:34    Procedures Procedures (including critical care time)  Medications Ordered in ED Medications  metroNIDAZOLE (FLAGYL) IVPB 500 mg (0 mg Intravenous Stopped 02/02/2018 1447)  ceFEPIme (MAXIPIME) 1 g in sodium chloride 0.9 % 100 mL IVPB (has no administration in time range)  vancomycin (VANCOCIN) IVPB 1000 mg/200 mL premix (has no administration in time range)  amiodarone (NEXTERONE) 1.8 mg/mL load via infusion 150 mg (has no administration in time range)  sodium chloride 0.9 % bolus 1,000 mL (0 mLs Intravenous Stopped 02/16/2018 1749)    And  sodium chloride 0.9 % bolus 1,000 mL (0 mLs Intravenous Stopped 02/06/2018 1750)    And  sodium chloride 0.9 % bolus 1,000 mL (1,000 mLs Intravenous New Bag/Given 02/24/2018 1503)  ceFEPIme (MAXIPIME) 2 g in sodium chloride 0.9 % 100 mL IVPB (0 g Intravenous Stopped 02/19/2018 1418)  vancomycin (VANCOCIN) IVPB 1000 mg/200 mL premix (0 mg Intravenous Stopped 02/01/2018 1527)  vancomycin (VANCOCIN) IVPB 1000 mg/200 mL premix (0 mg Intravenous  Stopped 02/07/18 1714)     Initial Impression / Assessment and Plan / ED Course  I have reviewed the triage vital signs and the nursing notes.  Pertinent labs & imaging results that were available during my care of the patient were reviewed by me and considered in my medical decision making (see chart for details).    Vitals notable for hypotension,  hypoxia, tachycardia and hypothermia. Patient received fluids, antibiotics per sepsis protocol.  Update after almost 3 L fluid the patient's blood pressure is improved, she is sitting upright, mentation is improved substantially. However, she has persistent atrial fibrillation. She denies history of this, states that she has any cardiologist, but is currently switching care.  5:59 PM Patient's blood pressure continues to be borderline, though heart rate has been slightly improved. Lactic acidosis has resolved, with repeat value 2.7, following 3 L fluid resuscitation. Patient's mentation is improved substantially. CT, consistent with possible pneumonia, does demonstrate pleural effusions bilaterally. Pneumonia most consistent with the patient's initial presentation with hypoxia, hypothermia, sepsis. I discussed patient's case with her cardiology colleagues on-call, and given the patient's persistent atrial fibrillation, hypotension, after appropriate fluid fluid resuscitation for sepsis, patient will start amiodarone. She has already received IV fluid, broad-spectrum antibiotics. Patient has improved substantially but, given her presentation concerning for sepsis with abnormalities of multiple systems including renal, hepatic, pulmonary, the patient will require admission to the stepdown unit.  Final Clinical Impressions(s) / ED Diagnoses  Sepsis   CRITICAL CARE Performed by: Gerhard Munch Total critical care time: 45 minutes Critical care time was exclusive of separately billable procedures and treating other patients. Critical care  was necessary to treat or prevent imminent or life-threatening deterioration. Critical care was time spent personally by me on the following activities: development of treatment plan with patient and/or surrogate as well as nursing, discussions with consultants, evaluation of patient's response to treatment, examination of patient, obtaining history from patient or surrogate, ordering and performing treatments and interventions, ordering and review of laboratory studies, ordering and review of radiographic studies, pulse oximetry and re-evaluation of patient's condition.    Gerhard Munch, MD 02/01/2018 [phone]

## 2018-02-07 NOTE — ED Notes (Signed)
Pt's O2 sats are extremely low on non rebreather. Am notifying Dr. Jeraldine Loots

## 2018-02-07 NOTE — ED Notes (Signed)
Patient refused foley catheter.

## 2018-02-07 NOTE — Progress Notes (Signed)
Pharmacy Antibiotic Note  KIERSTEN COSS is a 82 y.o. female admitted on 02/15/2018 with infection- unknown source.  Pharmacy has been consulted for Vancomycin and Cefepime dosing.  Plan: Vancomycin 2000 mg IV x 1 dose. Vancomycin 1000 mg  IV every 48 hours.  Goal trough 15-20 mcg/mL.  Cefepime 1000 mg IV every 24 hours. Flagyl 500 mg IV every 8 hours. Monitor labs, c/s, and vanco trough as indicated.  Height: 5' 7.5" (171.5 cm) Weight: 191 lb (86.6 kg) IBW/kg (Calculated) : 62.75  Temp (24hrs), Avg:96.8 F (36 C), Min:96.8 F (36 C), Max:96.8 F (36 C)  Recent Labs  Lab 02/18/2018 1347 02/01/2018 1423  WBC  --  14.3*  CREATININE  --  2.92*  LATICACIDVEN 8.97*  --     Estimated Creatinine Clearance: 17.5 mL/min (A) (by C-G formula based on SCr of 2.92 mg/dL (H)).    Allergies  Allergen Reactions  . Shrimp [Shellfish Allergy]     Antimicrobials this admission: Vanco 11/7 >>  Cefepime 11/7 >>  Flayl 11/7 >>  Dose adjustments this admission: Vanco/Cefepime  Microbiology results: 11/7 BCx: pending 11/7 UCx: pending    Thank you for allowing pharmacy to be a part of this patient's care.  Tad Moore 02/01/2018 4:00 PM

## 2018-02-07 NOTE — ED Notes (Addendum)
Lab unable to obtain blood. Another tech will come and try. Notified Dr Boykin Reaper wood gave order to start abt to prevent delay. ED nurse to obtain green top for lactic and one set blood cultures

## 2018-02-07 NOTE — Progress Notes (Signed)
CRITICAL VALUE ALERT  Critical Value:  trop Date & Time Notied:  02/01/2018 2210 Provider Notified: Alanda Slim Orders Received/Actions taken:  No orders at this time

## 2018-02-07 NOTE — ED Triage Notes (Signed)
Pt arrived via [facility] daughter found her iin the recliner that she had been in since last night, was fine yesterday, doc last week, everything was normal, fungus on feet and being tx'd for it. Yeast infection in breast. 150-180 HR with EMS CBG was 88, altered mental now and at baseline a&ox4

## 2018-02-07 NOTE — ED Notes (Signed)
Have slowed all fluids down to per hour. Dr. Jeraldine Loots aware

## 2018-02-07 NOTE — ED Notes (Signed)
Date and time results received: 2018-02-24 1345   Test: istat lactic  Critical Value: 8.97  Name of Provider Notified: Jeraldine Loots  Orders Received? Or Actions Taken?: code sepsis

## 2018-02-07 NOTE — ED Notes (Signed)
Dr Saul Fordyce notified of pts condition

## 2018-02-07 NOTE — ED Notes (Signed)
Have notified lab to come draw 2nd Lactic Acid

## 2018-02-07 NOTE — ED Notes (Signed)
Pt is now more comfortable and vital signs are improving. About to start new Bolus of fluids.

## 2018-02-07 NOTE — ED Notes (Signed)
Pt very difficult stick. Lab in room at this time to attempt blood draw

## 2018-02-07 NOTE — Progress Notes (Signed)
Pharmacy noted that patient's INR is elevated.  Stopping heparin.

## 2018-02-07 NOTE — H&P (Signed)
History and Physical    Nina Lowery ZOX:[id number] DOB: May 18, 1935 DOA: 02/14/2018  PCP: Joette Catching, MD Patient coming from: Home.  Lives alone.  Can ambulate using cane.  Chief Complaint: "I have not been feeling well for good while"  HPI: Nina Lowery is a 82 y.o. female with medical history significant for hypertension, hyperlipidemia, moderate aortic stenosis and chronic edema who was brought to ED by EMS due to altered mental status. History provided by patient's patient's daughter at bedside. Patient has not been feeling well for months this.  She has been fatigued and short of breath.  Symptoms gotten worse over the last 1 week.  Per daughter she was somehow confused when she took over the phone 2 nights ago.  When she went to check on her this morning, she was lying on the ground and altered.  She called EMS.  Per EMS report to EDP, patient was hypoxic, hypotensive and tachycardic.  Tachycardia improved with fluid on her way to ED.   Patient reports shortness of breath for months this, dry cough and leg swelling.  She says she has been out of her Lasix for the last 4 days.  Per daughter, her leg swelling is worse.  She has fluid oozing out of her leg. Her feet looked purplish. Patient denies chest pain, palpitation, nausea, vomiting, abdominal pain, dysuria, headache or neck tightness.  She lives alone.  Ambulates using cane.  Denies smoking cigarettes, drinking alcohol or recreational drug use.  In ED, hypotensive to 88/68.  New A. fib with RVR to 140s and 150s.  Hypoxic to 80%.  Sodium 128 .  Bicarb 14.  Anion gap greater than 20.  AST 133.  ALT 74.  Bilirubin 4.5.  BNP 288.  Lactic acid 8.97 and trended down to 2.3 after fluid resuscitation.  WBC 14.3.  Portable CXR with mild right pleural effusion with probable left basilar atelectasis.  CT chest without contrast with bilateral pleural effusion, right greater than left, cardiomegaly and possible bilateral lower lobe  pneumonia. Patient was resuscitated with IV fluid per sepsis protocol.  Blood culture obtained.  She was started on cefepime, vancomycin and Flagyl.  Cardiology was consulted about A. fib with RVR and recommended starting amiodarone drip.  Hospitalist service was called for admission.  ROS ROS  PMH Past Medical History:  Diagnosis Date  . HTN (hypertension)   . Hyperlipemia    PSH Past Surgical History:  Procedure Laterality Date  . GALLBLADDER SURGERY    . hysterectomy --- unknown     Fam HX History reviewed. No pertinent family history.   Social Hx  reports that she has never smoked. She does not have any smokeless tobacco history on file. Her alcohol and drug histories are not on file.  Allergy Allergies  Allergen Reactions  . Shrimp [Shellfish Allergy]    Home Meds Prior to Admission medications   Medication Sig Start Date End Date Taking? Authorizing Provider  fosinopril (MONOPRIL) 40 MG tablet Take 40 mg by mouth daily.   Yes [provider]  furosemide (LASIX) 40 MG tablet Take 40 mg by mouth daily.   Yes [provider]  imipramine (TOFRANIL) 50 MG tablet Take 50 mg by mouth at bedtime.   Yes [provider]  potassium chloride SA (K-DUR,KLOR-CON) 20 MEQ tablet Take 20 mEq by mouth daily.   Yes [provider]  acetaminophen (TYLENOL) 500 MG tablet Take 500 mg by mouth every 6 (six) hours as needed for  pain.    [provider]  busPIRone (BUSPAR) 15 MG tablet Take 15 mg by mouth 2 (two) times daily.    [provider]  celecoxib (CELEBREX) 200 MG capsule Take 200 mg by mouth 2 (two) times daily as needed for pain.    [provider]  cholecalciferol (VITAMIN D) 400 UNITS TABS tablet Take 400 Units by mouth daily.    [provider]  ferrous sulfate 325 (65 FE) MG tablet Take 325 mg by mouth daily with breakfast.    [provider]  hydrochlorothiazide (HYDRODIURIL) 25 MG tablet Take 25 mg  by mouth daily.    [provider]  HYDROcodone-acetaminophen (NORCO/VICODIN) 5-325 MG per tablet Take 1 tablet by mouth every 6 (six) hours as needed for pain.    [provider]    Physical Exam: Vitals:   23-Feb-2018 1637 Feb 23, 2018 1730 2018/02/23 1801 23-Feb-2018 1830  BP: 115/68 95/79 96/82  91/70  Pulse: (!) 148 (!) 144 (!) 145 (!) 146  Resp: (!) 24 20 18 18   Temp:      TempSrc:      SpO2: 100% 100% 100% 99%  Weight:      Height:        Constitutional: NAD, calm, on nonrebreather Eyes: lids and conjunctivae normal HENMT: atraumatic. Normocephalic. Hearing grossly intact. No rhinorrhea. MMM.  Poor dentition Neck: normal. Supple.  Notable JVD to mid neck.  Resp: On nonrebreather.  Fair air movement bilaterally.  Crackles bilaterally. CVS: Irregular with RVR to 140s.  S1 and S2 here.  GI: normal bowel sound. No tenderness. No distention. MSK: No obvious deformity. No focal tenderness. Moving extremities.  2+ edema bilaterally. Busted bullae on right leg.  Cold to touch. Skin: Busted bulla on right leg.  Cyanosis in both feet.  Both feet cold to touch.  See pictures below. Neuro: Awake. Alert. Oriented to self, place, month and person but not date and year.  CN 2-12 grossly intact. Light sensation intact. Normal strength. DTR symmetric. Psych: Calm. Normal judgment and insight.             Labs on Admission: I have personally reviewed following labs and imaging studies  CBC: Recent Labs  Lab 2018-02-23 1423  WBC 14.3*  NEUTROABS 11.8*  HGB 12.5  HCT 41.4  MCV 85.4  PLT 340   Basic Metabolic Panel: Recent Labs  Lab 2018/02/23 1423  NA 128*  K 4.4  CL 93*  CO2 14*  GLUCOSE 101*  BUN 69*  CREATININE 2.92*  CALCIUM 8.5*   GFR: Estimated Creatinine Clearance: 17.5 mL/min (A) (by C-G formula based on SCr of 2.92 mg/dL (H)). Liver Function Tests: Recent Labs  Lab 02-23-2018 1423  AST 133*  ALT 74*  ALKPHOS 105  BILITOT 4.5*  PROT 6.7  ALBUMIN  3.0*   No results for input(s): LIPASE, AMYLASE in the last 168 hours. No results for input(s): AMMONIA in the last 168 hours. Coagulation Profile: Recent Labs  Lab February 23, 2018 1423  INR 2.63   Cardiac Enzymes: No results for input(s): CKTOTAL, CKMB, CKMBINDEX, TROPONINI in the last 168 hours. BNP (last 3 results) No results for input(s): PROBNP in the last 8760 hours. HbA1C: No results for input(s): HGBA1C in the last 72 hours. CBG: No results for input(s): GLUCAP in the last 168 hours. Lipid Profile: No results for input(s): CHOL, HDL, LDLCALC, TRIG, CHOLHDL, LDLDIRECT in the last 72 hours. Thyroid Function Tests: No results for input(s): TSH, T4TOTAL, FREET4, T3FREE, THYROIDAB in  the last 72 hours. Anemia Panel: No results for input(s): VITAMINB12, FOLATE, FERRITIN, TIBC, IRON, RETICCTPCT in the last 72 hours. Urine analysis:    Component Value Date/Time   COLORURINE AMBER (A) 03-05-2018 1234   APPEARANCEUR HAZY (A) 03-05-2018 1234   LABSPEC 1.019 Mar 05, 2018 1234   PHURINE 5.0 05-Mar-2018 1234   GLUCOSEU NEGATIVE Mar 05, 2018 1234   HGBUR NEGATIVE 03/05/2018 1234   BILIRUBINUR NEGATIVE 2018/03/05 1234   KETONESUR NEGATIVE 2018-03-05 1234   PROTEINUR 30 (A) 2018/03/05 1234   NITRITE NEGATIVE 03/05/18 1234   LEUKOCYTESUR NEGATIVE March 05, 2018 1234    Sepsis Labs:  Lactic acid 8.97 and trended down to 2.3 after IV fluid. ) Recent Results (from the past 240 hour(s))  Culture, blood (Routine x 2)     Status: None (Preliminary result)   Collection Time: 05-Mar-2018  1:40 PM  Result Value Ref Range Status   Specimen Description RIGHT ANTECUBITAL  Final   Special Requests   Final    BOTTLES DRAWN AEROBIC AND ANAEROBIC Blood Culture results may not be optimal due to an excessive volume of blood received in culture bottles Performed at [facility], [address] [place], [place] [address]    Culture PENDING  Incomplete   Report Status PENDING  Incomplete  Culture, blood  (Routine x 2)     Status: None (Preliminary result)   Collection Time: 2018-03-05  2:24 PM  Result Value Ref Range Status   Specimen Description LEFT ANTECUBITAL  Final   Special Requests   Final    BOTTLES DRAWN AEROBIC ONLY Blood Culture adequate volume Performed at [facility], [address], [place], [place] [address]    Culture PENDING  Incomplete   Report Status PENDING  Incomplete     Radiological Exams on Admission: Ct Chest Wo Contrast  Result Date: 2018-03-05 CLINICAL DATA:  Chronic shortness of breath, pleurisy, difficulty breathing EXAM: CT CHEST WITHOUT CONTRAST TECHNIQUE: Multidetector CT imaging of the chest was performed following the standard protocol without IV contrast. COMPARISON:  Portable chest x-ray of 02/07/2017 FINDINGS: Cardiovascular: The heart is mildly enlarged and there are diffuse coronary artery calcifications present. No pericardial effusion is seen. The mid ascending thoracic aorta measures 39 mm in diameter. Mediastinum/Nodes: No mediastinal or hilar adenopathy is seen. The thyroid gland is unremarkable although not optimally visualized. The does appear to be a moderate size hiatal hernia present. Lungs/Pleura: There are bilateral pleural effusions present. Opacities are noted in both lower lobes left greater than right with air bronchograms. Some of these opacities could be due to atelectasis, but pneumonia particularly in the left lower lobe is a definite consideration. There is some debris within the bronchi to the lower lobes and aspiration would be a consideration. Upper Abdomen: On images through the upper abdomen, no significant abnormality is seen. There may be some enlargement of the left adrenal gland which is not completely imaged. Musculoskeletal: The thoracic vertebrae are in normal alignment with only mild degenerative change. Some motion does obscure evaluation of several areas, such as the sternum on the lateral view. IMPRESSION: 1. Bilateral pleural  effusions right greater than left with bilateral lower lobe atelectasis and/or pneumonia left-greater-than-right. Cannot exclude aspiration. 2. Moderate size hiatal hernia. 3. Cardiomegaly and diffuse coronary artery calcifications. Electronically Signed   By: Dwyane Dee M.D.   On: 03-05-18 16:41   Dg Chest Port 1 View  Result Date: March 05, 2018 CLINICAL DATA:  Respiratory distress. EXAM: PORTABLE CHEST 1 VIEW COMPARISON:  None. FINDINGS: Mild cardiomegaly is  noted. No pneumothorax is noted. Mild right pleural effusion is noted with probable underlying atelectasis. Mild left basilar atelectasis may be present. Bony thorax is unremarkable. IMPRESSION: Mild right pleural effusion with probable underlying atelectasis. Probable mild left basilar atelectasis. Electronically Signed   By: Lupita Raider, M.D.   On: 02-24-2018 13:34    All images have been reviewed by me personally.   EKG: Independently reviewed.  A. fib with RVR  Assessment/Plan Principal Problem:   Sepsis with acute hypoxic respiratory failure (HCC) Active Problems:   Dyspnea on exertion   HTN (hypertension)   Atrial fibrillation with RVR (HCC)   Lactic acidosis   Aortic stenosis   High anion gap metabolic acidosis   Elevated liver enzymes   Elevated brain natriuretic peptide (BNP) level   Hyperbilirubinemia   Leukocytosis   Sepsis with acute hypoxic respiratory failure: Presented with altered mental status, hypotension and hypoxemia.  CT chest concerning for pneumonia.  Lactic acidosis to 8.97 that has trended down to 2.3 after fluid resuscitation.  Hypotension resolved. -Continue broad-spectrum antibiotics and de-escalate as appropriate -Follow-up cultures -Trend procalcitonin  CAP/leukocytosis: CT chest concerning for pneumonia but not definitive. -Continue antibiotics as above -Trend procalcitonin.  New A. fib with RVR: RVR to 140s.  Chads vasc score 4-5.  Cardiology consulted by EDP and recommended  amiodarone. -Continue amiodarone per cardiology -start heparin.  New CHF unknown type/moderate aortic stenosis/chronic edema: Cardiomegaly on chest x-ray.  BNP elevated.  History of moderate aortic stenosis per care everywhere but denies CHF.  Not able to access a echocardiogram from [facility]. Exam with JVD, bilateral crackles, 2+ edema bilaterally and cyanosis in both feet. Has been out of her Lasix for the last 4 days. -We will start diuresis once stabilized from hemodynamic standpoint.  She may need inotropes and diuretics. -Daily weight, intake and output -Echocardiogram  Lactic acidosis: Could be due to sepsis.  Also hypoperfusion due to hypotension.  Trended from 8.97-2.3 after IV fluid resuscitation. -Continue trending  High anion gap metabolic acidosis: Likely due to lactic acidosis. -Repeat BMP.  Elevated liver enzymes/hyperbilirubinemia: Concern about congestive hepatopathy.  Pattern not consistent with ischemic liver shock. -Continue trending  DVT prophylaxis: On heparin drip for atrial fibrillation Code Status: Full code Family Communication: Daughter at bedside Disposition Plan: Admit to stepdown with low threshold to escalate to ICU. Consults called: Cardiology Admission status: Inpatient status.  Patient with sepsis, A. fib with RVR and fluid overload concerning for CHF.  Very high risk for deconditioning.  She will need at least 2 midnights to recover from her critical illness and adequately diuresed.   Almon Hercules MD [facility] Pager [phone]  If 7PM-7AM, please contact night-coverage [email or web address] Password [facility]  02/24/2018, 7:33 PM

## 2018-02-07 NOTE — ED Notes (Signed)
CRITICAL VALUE ALERT  Critical Value:  Lactic 8.97  Date & Time Notied:  1345 02/20/2018  Provider Notified: Dr Jeraldine Loots  Orders Received/Actions taken:

## 2018-02-07 NOTE — ED Notes (Signed)
Date and time results received: 02/17/18 1723 (use smartphrase ".now" to insert current time)  Test: lactic acid Critical Value: 2.3  Name of Provider Notified: lockwood  Orders Received? Or Actions Taken?: see orders

## 2018-02-07 NOTE — Progress Notes (Signed)
Bladder scan showed 638cc in bladder. Pt had initially refused Foley catheter. Have explained to pt we would give her until midnight to urinate and if she is unable to then we will have to place foley to relieve her bladder of all the urine.

## 2018-02-07 NOTE — ED Notes (Addendum)
Placed pt on bedpan, pt had small amount of diarrhea. Pt has not urinated since arrival. Dr Jeraldine Loots notified.

## 2018-02-08 ENCOUNTER — Inpatient Hospital Stay (HOSPITAL_COMMUNITY): Payer: Medicare Other

## 2018-02-08 ENCOUNTER — Other Ambulatory Visit (HOSPITAL_COMMUNITY): Payer: Medicare Other

## 2018-02-08 ENCOUNTER — Encounter (HOSPITAL_COMMUNITY): Payer: Self-pay | Admitting: Internal Medicine

## 2018-02-08 DIAGNOSIS — G9341 Metabolic encephalopathy: Secondary | ICD-10-CM

## 2018-02-08 DIAGNOSIS — A419 Sepsis, unspecified organism: Principal | ICD-10-CM

## 2018-02-08 DIAGNOSIS — I5033 Acute on chronic diastolic (congestive) heart failure: Secondary | ICD-10-CM

## 2018-02-08 DIAGNOSIS — R7401 Elevation of levels of liver transaminase levels: Secondary | ICD-10-CM

## 2018-02-08 DIAGNOSIS — L8990 Pressure ulcer of unspecified site, unspecified stage: Secondary | ICD-10-CM

## 2018-02-08 DIAGNOSIS — Z7189 Other specified counseling: Secondary | ICD-10-CM

## 2018-02-08 DIAGNOSIS — R6521 Severe sepsis with septic shock: Secondary | ICD-10-CM

## 2018-02-08 DIAGNOSIS — N179 Acute kidney failure, unspecified: Secondary | ICD-10-CM | POA: Diagnosis present

## 2018-02-08 DIAGNOSIS — R740 Nonspecific elevation of levels of transaminase and lactic acid dehydrogenase [LDH]: Secondary | ICD-10-CM

## 2018-02-08 DIAGNOSIS — R6520 Severe sepsis without septic shock: Secondary | ICD-10-CM

## 2018-02-08 DIAGNOSIS — J9601 Acute respiratory failure with hypoxia: Secondary | ICD-10-CM

## 2018-02-08 DIAGNOSIS — I350 Nonrheumatic aortic (valve) stenosis: Secondary | ICD-10-CM

## 2018-02-08 DIAGNOSIS — J690 Pneumonitis due to inhalation of food and vomit: Secondary | ICD-10-CM

## 2018-02-08 DIAGNOSIS — R748 Abnormal levels of other serum enzymes: Secondary | ICD-10-CM

## 2018-02-08 DIAGNOSIS — I4891 Unspecified atrial fibrillation: Secondary | ICD-10-CM

## 2018-02-08 DIAGNOSIS — E872 Acidosis: Secondary | ICD-10-CM

## 2018-02-08 LAB — COMPREHENSIVE METABOLIC PANEL WITH GFR
ALT: 137 U/L — ABNORMAL HIGH (ref 0–44)
AST: 251 U/L — ABNORMAL HIGH (ref 15–41)
Albumin: 3 g/dL — ABNORMAL LOW (ref 3.5–5.0)
Alkaline Phosphatase: 107 U/L (ref 38–126)
Anion gap: 19 — ABNORMAL HIGH (ref 5–15)
BUN: 68 mg/dL — ABNORMAL HIGH (ref 8–23)
CO2: 12 mmol/L — ABNORMAL LOW (ref 22–32)
Calcium: 8.2 mg/dL — ABNORMAL LOW (ref 8.9–10.3)
Chloride: 94 mmol/L — ABNORMAL LOW (ref 98–111)
Creatinine, Ser: 3.02 mg/dL — ABNORMAL HIGH (ref 0.44–1.00)
GFR calc Af Amer: 16 mL/min — ABNORMAL LOW
GFR calc non Af Amer: 14 mL/min — ABNORMAL LOW
Glucose, Bld: 80 mg/dL (ref 70–99)
Potassium: 4.7 mmol/L (ref 3.5–5.1)
Sodium: 125 mmol/L — ABNORMAL LOW (ref 135–145)
Total Bilirubin: 4.2 mg/dL — ABNORMAL HIGH (ref 0.3–1.2)
Total Protein: 6.7 g/dL (ref 6.5–8.1)

## 2018-02-08 LAB — BLOOD GAS, ARTERIAL
ACID-BASE DEFICIT: 11.4 mmol/L — AB (ref 0.0–2.0)
Acid-base deficit: 13 mmol/L — ABNORMAL HIGH (ref 0.0–2.0)
Bicarbonate: 14.5 mmol/L — ABNORMAL LOW (ref 20.0–28.0)
Bicarbonate: 14.9 mmol/L — ABNORMAL LOW (ref 20.0–28.0)
Delivery systems: POSITIVE
Delivery systems: POSITIVE
Drawn by: 38235
Drawn by: 234301
Expiratory PAP: 6
Expiratory PAP: 6
FIO2: 45
FIO2: 45
Inspiratory PAP: 12
Inspiratory PAP: 12
O2 Saturation: 99.1 %
O2 Saturation: 71 %
PATIENT TEMPERATURE: 37
PCO2 ART: 37.8 mmHg (ref 32.0–48.0)
PH ART: 7.219 — AB (ref 7.350–7.450)
PO2 ART: 46.7 mmHg — AB (ref 83.0–108.0)
Patient temperature: 37
RATE: 8 {breaths}/min
pCO2 arterial: 29.6 mmHg — ABNORMAL LOW (ref 32.0–48.0)
pH, Arterial: 7.257 — ABNORMAL LOW (ref 7.350–7.450)
pO2, Arterial: 167 mmHg — ABNORMAL HIGH (ref 83.0–108.0)

## 2018-02-08 LAB — BASIC METABOLIC PANEL
Anion gap: >20 — ABNORMAL HIGH (ref 5–15)
BUN: 67 mg/dL — ABNORMAL HIGH (ref 8–23)
CALCIUM: 8.1 mg/dL — AB (ref 8.9–10.3)
CHLORIDE: 96 mmol/L — AB (ref 98–111)
CO2: 12 mmol/L — AB (ref 22–32)
Creatinine, Ser: 3.15 mg/dL — ABNORMAL HIGH (ref 0.44–1.00)
GFR calc non Af Amer: 13 mL/min — ABNORMAL LOW (ref >60)
GFR, EST AFRICAN AMERICAN: 15 mL/min — AB (ref >60)
GLUCOSE: 71 mg/dL (ref 70–99)
POTASSIUM: 4.9 mmol/L (ref 3.5–5.1)
Sodium: 129 mmol/L — ABNORMAL LOW (ref 135–145)

## 2018-02-08 LAB — MAGNESIUM: Magnesium: 2.4 mg/dL (ref 1.7–2.4)

## 2018-02-08 LAB — CBC
HCT: 45.1 % (ref 36.0–46.0)
Hemoglobin: 13.3 g/dL (ref 12.0–15.0)
MCH: 25.5 pg — AB (ref 26.0–34.0)
MCHC: 29.5 g/dL — ABNORMAL LOW (ref 30.0–36.0)
MCV: 86.6 fL (ref 80.0–100.0)
PLATELETS: 362 10*3/uL (ref 150–400)
RBC: 5.21 MIL/uL — AB (ref 3.87–5.11)
RDW: 17.2 % — AB (ref 11.5–15.5)
WBC: 16.3 10*3/uL — ABNORMAL HIGH (ref 4.0–10.5)
nRBC: 0.2 % (ref 0.0–0.2)

## 2018-02-08 LAB — PROTIME-INR
INR: 3.22
Prothrombin Time: 32.4 seconds — ABNORMAL HIGH (ref 11.4–15.2)

## 2018-02-08 LAB — CORTISOL-AM, BLOOD: Cortisol - AM: >100 ug/dL — ABNORMAL HIGH (ref 6.7–22.6)

## 2018-02-08 LAB — PROCALCITONIN
PROCALCITONIN: 0.5 ng/mL
Procalcitonin: 0.45 ng/mL

## 2018-02-08 LAB — TROPONIN I
TROPONIN I: 0.05 ng/mL — AB (ref <0.03)
Troponin I: 0.07 ng/mL (ref <0.03)

## 2018-02-08 LAB — HEPATIC FUNCTION PANEL
ALK PHOS: 87 U/L (ref 38–126)
ALT: 173 U/L — ABNORMAL HIGH (ref 0–44)
AST: 355 U/L — ABNORMAL HIGH (ref 15–41)
Albumin: 3.4 g/dL — ABNORMAL LOW (ref 3.5–5.0)
BILIRUBIN DIRECT: 2.5 mg/dL — AB (ref 0.0–0.2)
BILIRUBIN INDIRECT: 1.4 mg/dL — AB (ref 0.3–0.9)
TOTAL PROTEIN: 6.2 g/dL — AB (ref 6.5–8.1)
Total Bilirubin: 3.9 mg/dL — ABNORMAL HIGH (ref 0.3–1.2)

## 2018-02-08 LAB — URINE CULTURE
Culture: NO GROWTH
Special Requests: NORMAL

## 2018-02-08 LAB — LACTIC ACID, PLASMA
LACTIC ACID, VENOUS: 5.9 mmol/L — AB (ref 0.5–1.9)
LACTIC ACID, VENOUS: 7.3 mmol/L — AB (ref 0.5–1.9)
Lactic Acid, Venous: 6 mmol/L (ref 0.5–1.9)

## 2018-02-08 MED ORDER — HYDROCORTISONE NA SUCCINATE PF 100 MG IJ SOLR
100.0000 mg | Freq: Three times a day (TID) | INTRAMUSCULAR | Status: DC
  Administered 2018-02-08: 100 mg via INTRAVENOUS
  Filled 2018-02-08: qty 2

## 2018-02-08 MED ORDER — SODIUM BICARBONATE 8.4 % IV SOLN
INTRAVENOUS | Status: AC
  Filled 2018-02-08: qty 150

## 2018-02-08 MED ORDER — PHENYLEPHRINE HCL-NACL 10-0.9 MG/250ML-% IV SOLN
0.0000 ug/min | INTRAVENOUS | Status: DC
  Administered 2018-02-08: 80 ug/min via INTRAVENOUS
  Administered 2018-02-08: 20 ug/min via INTRAVENOUS
  Filled 2018-02-08 (×2): qty 250

## 2018-02-08 MED ORDER — SODIUM CHLORIDE 0.9 % IV BOLUS
500.0000 mL | Freq: Once | INTRAVENOUS | Status: AC
  Administered 2018-02-08: 500 mL via INTRAVENOUS

## 2018-02-08 MED ORDER — MORPHINE SULFATE (PF) 4 MG/ML IV SOLN: 4.0000 mg | INTRAVENOUS | Status: DC | PRN

## 2018-02-08 MED ORDER — ALBUMIN HUMAN 25 % IV SOLN
50.0000 g | Freq: Once | INTRAVENOUS | Status: AC
  Administered 2018-02-08: 50 g via INTRAVENOUS
  Filled 2018-02-08: qty 100

## 2018-02-08 MED ORDER — LACTATED RINGERS IV BOLUS
500.0000 mL | Freq: Once | INTRAVENOUS | Status: AC
  Administered 2018-02-08: 500 mL via INTRAVENOUS

## 2018-02-08 MED ORDER — SODIUM BICARBONATE 8.4 % IV SOLN
INTRAVENOUS | Status: DC
  Administered 2018-02-08: 07:00:00 via INTRAVENOUS
  Filled 2018-02-08 (×9): qty 150

## 2018-02-08 MED ORDER — MORPHINE SULFATE (PF) 2 MG/ML IV SOLN
2.0000 mg | INTRAVENOUS | Status: DC | PRN
  Administered 2018-02-08: 4 mg via INTRAVENOUS
  Administered 2018-02-08: 2 mg via INTRAVENOUS
  Filled 2018-02-08 (×3): qty 1

## 2018-02-08 MED ORDER — SODIUM BICARBONATE 8.4 % IV SOLN
50.0000 meq | Freq: Once | INTRAVENOUS | Status: AC
  Administered 2018-02-08: 50 meq via INTRAVENOUS
  Filled 2018-02-08: qty 50

## 2018-02-08 MED ORDER — NOREPINEPHRINE 4 MG/250ML-% IV SOLN
0.0000 ug/min | INTRAVENOUS | Status: DC
  Administered 2018-02-08: 5 ug/min via INTRAVENOUS
  Filled 2018-02-08: qty 250

## 2018-02-08 MED ORDER — SODIUM CHLORIDE 0.9 % IV BOLUS
1000.0000 mL | Freq: Once | INTRAVENOUS | Status: AC
  Administered 2018-02-08: 1000 mL via INTRAVENOUS

## 2018-02-08 MED ORDER — LORAZEPAM 2 MG/ML IJ SOLN
0.5000 mg | Freq: Once | INTRAMUSCULAR | Status: AC | PRN
  Administered 2018-02-08: 0.5 mg via INTRAVENOUS
  Filled 2018-02-08: qty 1

## 2018-02-08 MED ORDER — SODIUM CHLORIDE 0.9 % IV SOLN
0.0000 ug/min | INTRAVENOUS | Status: DC
  Administered 2018-02-08: 130 ug/min via INTRAVENOUS
  Filled 2018-02-08 (×2): qty 4

## 2018-02-08 MED ORDER — PIPERACILLIN-TAZOBACTAM 3.375 G IVPB
3.3750 g | Freq: Two times a day (BID) | INTRAVENOUS | Status: DC
  Administered 2018-02-08: 3.375 g via INTRAVENOUS
  Filled 2018-02-08: qty 50

## 2018-02-08 MED ORDER — SODIUM CHLORIDE 0.9 % IV SOLN
INTRAVENOUS | Status: DC
  Administered 2018-02-08: 05:00:00 via INTRAVENOUS

## 2018-02-08 MED ORDER — LORAZEPAM 2 MG/ML IJ SOLN
1.0000 mg | INTRAMUSCULAR | Status: DC | PRN
  Administered 2018-02-08 (×2): 1 mg via INTRAVENOUS
  Filled 2018-02-08 (×2): qty 1

## 2018-02-08 MED ORDER — DIGOXIN 0.25 MG/ML IJ SOLN
0.5000 mg | Freq: Once | INTRAMUSCULAR | Status: AC
  Administered 2018-02-08: 0.5 mg via INTRAVENOUS
  Filled 2018-02-08: qty 2

## 2018-02-08 MED ORDER — LORAZEPAM 2 MG/ML IJ SOLN: 0.5000 mg | INTRAMUSCULAR | Status: DC | PRN

## 2018-02-12 LAB — CULTURE, BLOOD (ROUTINE X 2)
CULTURE: NO GROWTH
Culture: NO GROWTH
Special Requests: ADEQUATE

## 2018-02-21 ENCOUNTER — Ambulatory Visit: Payer: Medicare Other | Admitting: Cardiovascular Disease

## 2018-03-03 NOTE — Progress Notes (Signed)
CRITICAL VALUE ALERT  Critical Value:  Lactic 6.0 Date & Time Notied: 03-Mar-2018 0530 Provider Notified:Ortiz  Orders Received/Actions taken:  No orders at this time

## 2018-03-03 NOTE — Death Summary Note (Signed)
DEATH SUMMARY   Patient Details  Name: Nina Lowery MRN: [id number] DOB: 01/28/1936  Admission/Discharge Information   Admit Date:  February 23, 2018  Date of Death: Date of Death: 02-24-2018  Time of Death: Time of Death: Aug 24, 1832  Length of Stay: 1  Referring Physician: Joette Catching, MD   Reason(s) for Hospitalization  Dyspnea, altered mental status  Diagnoses  Preliminary cause of death: sepsis Secondary Diagnoses (including complications and co-morbidities):  Sepsis -Secondary to pneumonia, but suspect underlying bacteremia -UA negative for pyuria -Personally reviewed chest x-ray--increased interstitial markings, right lower lobe opacity, small bilateral pleural effusions -Procalcitonin 0.35 -Lactic acid peaked at 8.97 -Continue vancomycin, cefepime, metronidazole pending culture data -Follow blood cultures -Influenza PCR negative -Start Solu-Cortef -Continue Neo-Synephrine for blood pressure support-->maxed out -started levophed  Pneumonia/aspiration pneumonitis -MRSA PCR negative -Continue antibiotics empirically pending clinical progression and culture data -02-23-2018 CT chest--bilateral pleural effusion, right greater than left; debris within the bronchi in the lower lobes; bibasilar opacities, left greater than right with air bronchograms -cefepime and flagyl changed to zosyn due to concerns of prolonged QTc from flagyl  Coagulopathy -INR 3.22 -Likely secondary to DIC secondary to sepsis -Monitor for signs of bleeding -Monitor coags  Atrial fibrillation with RVR, type unspecified -Continue amiodarone drip -could not use diltiazem or metoprolol due to hypotension -digoxin x 1 given -TSH 1.044 -CHADSVASc = 6  Acute on chronic diastolic CHF -Echocardiogram--could not be performed due to tachycardia -Judicious IV furosemide -Daily weights -Accurate I's and O's  Acute kidney injury -Secondary to sepsis and hemodynamic changes -Baseline creatinine  0.5-0.7 -Serum creatinine peaked 3.02 -Renal ultrasound--neg hydronephrosis -d/c fosinopril  Metabolic acidosis -Secondary to acute kidney injury, sepsis -Continue bicarbonate drip -given amps of bicarbonate  Acute respiratory failure with hypoxia -Secondary to pulmonary edema and pneumonia -Wean oxygen for saturation greater 92% -Pulmonary hygiene -required to be placed on bipap  Acute metabolic encephalopathy -Secondary to sepsis -At baseline, patient is alert and oriented x4 and able to perform all activities of daily living -Patient is at risk for hospital delirium -Remains confused and intermittently agitated -Haldol as needed agitation  Hyponatremia -Secondary to acute kidney injury and fluid overload  Essential hypertension -Holding lisinopril and HCTZ secondary to hypotension  Transaminasemia -Secondary to sepsis/shock liver -Abdominal ultrasound--no acute findings  Lower extremity pain and edema -Venous duplex rule out DVT--neg  Brief Hospital Course (including significant findings, care, treatment, and services provided and events leading to death)  Nina Lowery is a 82 year old female with a history of moderate aortic stenosis, anemia, hypertension, diastolic CHF, hyperlipidemia, anxiety presenting with altered mental status.  Because of the patient's extremis and altered mental status, she is unable to provide any significant history.  According to the patient's daughter, the patient appeared to sound confused on the telephone approximately 2 days prior to this admission.  Patient has had generalized malaise and has not been feeling well in general for at least 1 month.  However in the past week, there is been complaints of increasing shortness of breath, coughing, and fatigue.  In addition, the patient has been out of her furosemide for 4 days prior to admission.  When the patient's daughter went to check up on the patient on the morning of February 23, 2018,  the patient was found lying on the ground confused.  EMS was activated.  The patient was noted to be tachycardic with heart rate in the 150s and CBG of 88 per EMS.  In the emergency department, the patient was hypotensive, hypoxic  and tachycardic.  Initial work-up showed lactic acid of 8.97.  The patient was started on fluid resuscitation.  Urinalysis was negative for pyuria.  WBC was 14.3.  Chest x-ray showed small bilateral pleural effusions with right lower lobe opacity.  The patient was started on intravenous antibiotics for presumptive sepsis.  She was also started on IVF, solucortef and bicarbonate.  neosynephrine and then levophed needed to be started with refractory hypotension.  Repeat labs and abg continued to show progressive metabolic and lactic acidosis.  Additional bicarbonate amps were given.  Pt was placed on bipap.  Throughout the day, the patient's daughter was updated and goals of care were discussed. remains hypoxic on bipap requiring 100% FiO2 -discussed overall poor prognosis and continued clinical deterioration throughout the day despite optimal therapy -daughter who is main decision maker at bedside -she states "I don't want her to suffer anymore" -daughter expressed she did not want any more xrays or blood work and wanted to transition patient's focus of care to focus on full comfort -d/c drips and antibiotics and start morphine prn sob, pain   Pertinent Labs and Studies  Significant Diagnostic Studies Ct Chest Wo Contrast  Result Date: 03/02/2018 CLINICAL DATA:  Chronic shortness of breath, pleurisy, difficulty breathing EXAM: CT CHEST WITHOUT CONTRAST TECHNIQUE: Multidetector CT imaging of the chest was performed following the standard protocol without IV contrast. COMPARISON:  Portable chest x-ray of 02/07/2017 FINDINGS: Cardiovascular: The heart is mildly enlarged and there are diffuse coronary artery calcifications present. No pericardial effusion is seen. The mid ascending  thoracic aorta measures 39 mm in diameter. Mediastinum/Nodes: No mediastinal or hilar adenopathy is seen. The thyroid gland is unremarkable although not optimally visualized. The does appear to be a moderate size hiatal hernia present. Lungs/Pleura: There are bilateral pleural effusions present. Opacities are noted in both lower lobes left greater than right with air bronchograms. Some of these opacities could be due to atelectasis, but pneumonia particularly in the left lower lobe is a definite consideration. There is some debris within the bronchi to the lower lobes and aspiration would be a consideration. Upper Abdomen: On images through the upper abdomen, no significant abnormality is seen. There may be some enlargement of the left adrenal gland which is not completely imaged. Musculoskeletal: The thoracic vertebrae are in normal alignment with only mild degenerative change. Some motion does obscure evaluation of several areas, such as the sternum on the lateral view. IMPRESSION: 1. Bilateral pleural effusions right greater than left with bilateral lower lobe atelectasis and/or pneumonia left-greater-than-right. Cannot exclude aspiration. 2. Moderate size hiatal hernia. 3. Cardiomegaly and diffuse coronary artery calcifications. Electronically Signed   By: Dwyane Dee M.D.   On: 02/10/2018 16:41   US Abdomen Complete  Result Date: 02/11/18 CLINICAL DATA:  82 y/o F; multiorgan failure sepsis. Altered mental status. EXAM: ABDOMEN ULTRASOUND COMPLETE COMPARISON:  None. FINDINGS: Gallbladder: Cholecystectomy. Common bile duct: Diameter: 7 mm Liver: No focal lesion identified. Normal liver echogenicity. Mild lobulation of liver contour. Portal vein is patent on color Doppler imaging with normal direction of blood flow towards the liver. IVC: No abnormality visualized. Pancreas: Visualized portion unremarkable. Spleen: Size and appearance within normal limits. Right Kidney: Length: 11.6 cm. Echogenicity within  normal limits. No hydronephrosis. 1.9 cm hypoechoic focus within the lower pole of the kidney contiguous with the medullary fat. Left Kidney: Length: 12.4 cm. Incompletely visualized. Echogenicity within normal limits. No mass or hydronephrosis visualized. Abdominal aorta: Largely obscured by bowel gas. Visible segments are  normal in caliber. Other findings: None. IMPRESSION: 1. No acute process identified. 2. Mild lobulation of liver contour may reflect cirrhosis. 3. 19 mm echogenic focus in right kidney lower pole, possibly angiomyolipoma or a prominent medullary column. Electronically Signed   By: Mitzi Hansen M.D.   On: Mar 07, 2018 14:14   US Venous Img Lower Bilateral  Result Date: 03/07/18 CLINICAL DATA:  Bilateral lower extremity pain and edema. Shortness of breath for the past week. Evaluate for DVT. EXAM: BILATERAL LOWER EXTREMITY VENOUS DOPPLER ULTRASOUND TECHNIQUE: Gray-scale sonography with graded compression, as well as color Doppler and duplex ultrasound were performed to evaluate the lower extremity deep venous systems from the level of the common femoral vein and including the common femoral, femoral, profunda femoral, popliteal and calf veins including the posterior tibial, peroneal and gastrocnemius veins when visible. The superficial great saphenous vein was also interrogated. Spectral Doppler was utilized to evaluate flow at rest and with distal augmentation maneuvers in the common femoral, femoral and popliteal veins. COMPARISON:  None. FINDINGS: Examination is degraded due to patient body habitus and poor sonographic window. RIGHT LOWER EXTREMITY Common Femoral Vein, saphenofemoral junction and profundus femoral vein: Not evaluated secondary to presence of a right femoral approach central venous catheter. Femoral Vein: The proximal aspect the right femoral vein was not evaluated secondary to presence of a right femoral approach central venous catheter. No evidence of thrombus  within the mid and distal aspects of the right femoral vein. Normal compressibility, respiratory phasicity and response to augmentation. Popliteal Vein: No evidence of thrombus. Normal compressibility, respiratory phasicity and response to augmentation. Calf Veins: Appear patent where imaged. Superficial Great Saphenous Vein: No evidence of thrombus. Normal compressibility. Other Findings:  None. LEFT LOWER EXTREMITY Common Femoral Vein: No evidence of thrombus. Normal compressibility, respiratory phasicity and response to augmentation. Saphenofemoral Junction: No evidence of thrombus. Normal compressibility and flow on color Doppler imaging. Profunda Femoral Vein: No evidence of thrombus. Normal compressibility and flow on color Doppler imaging. Femoral Vein: No evidence of thrombus. Normal compressibility, respiratory phasicity and response to augmentation. Popliteal Vein: No evidence of thrombus. Normal compressibility, respiratory phasicity and response to augmentation. Calf Veins: Appear patent where imaged. Superficial Great Saphenous Vein: No evidence of thrombus. Normal compressibility. Other Findings:  None. IMPRESSION: No evidence of DVT within either lower extremity on this body habitus degraded examination. Electronically Signed   By: Simonne Come M.D.   On: 03-07-2018 14:22   Dg Chest Port 1 View  Result Date: Mar 07, 2018 CLINICAL DATA:  Acute respiratory failure with hypoxia. EXAM: PORTABLE CHEST 1 VIEW COMPARISON:  2018-03-07 FINDINGS: Enlarged cardiac silhouette. Bilateral lower lobe airspace consolidation versus atelectasis. Bilateral pleural effusions. Osseous structures are without acute abnormality. Soft tissues are grossly normal. IMPRESSION: Bilateral lower lobe airspace consolidation versus atelectasis with bilateral pleural effusions, not significantly changed. Electronically Signed   By: Ted Mcalpine M.D.   On: 03/07/2018 12:28   Dg Chest Port 1 View  Result Date:  2018/03/07 CLINICAL DATA:  Difficulty breathing EXAM: PORTABLE CHEST 1 VIEW COMPARISON:  02/20/2018 chest CT FINDINGS: Moderate cardiomegaly with small pleural effusions and associated atelectasis. No pneumothorax. No overt pulmonary edema. IMPRESSION: Moderate cardiomegaly with small pleural effusions and associated atelectasis. Electronically Signed   By: Deatra Robinson M.D.   On: 03-07-2018 01:41   Dg Chest Port 1 View  Result Date: 02/10/2018 CLINICAL DATA:  Respiratory distress. EXAM: PORTABLE CHEST 1 VIEW COMPARISON:  None. FINDINGS: Mild cardiomegaly is noted. No pneumothorax is noted.  Mild right pleural effusion is noted with probable underlying atelectasis. Mild left basilar atelectasis may be present. Bony thorax is unremarkable. IMPRESSION: Mild right pleural effusion with probable underlying atelectasis. Probable mild left basilar atelectasis. Electronically Signed   By: Lupita Raider, M.D.   On: 2018/03/05 13:34    Microbiology Recent Results (from the past 240 hour(s))  Urine culture     Status: None   Collection Time: 03-05-18 12:34 PM  Result Value Ref Range Status   Specimen Description   Final    URINE, CATHETERIZED Performed at [facility], [address], [place], [place] [address]    Special Requests   Final    Normal Performed at [facility], [address] [place], [place] [address]    Culture   Final    NO GROWTH Performed at [facility], [address] [place], [place] [address]    Report Status 02/17/2018 FINAL  Final  Culture, blood (Routine x 2)     Status: None (Preliminary result)   Collection Time: 2018-03-05  1:40 PM  Result Value Ref Range Status   Specimen Description RIGHT ANTECUBITAL  Final   Special Requests   Final    BOTTLES DRAWN AEROBIC AND ANAEROBIC Blood Culture results may not be optimal due to an excessive volume of blood received in culture bottles   Culture   Final    NO GROWTH < 24 HOURS Performed at [facility], [address], [place], [place] [address]    Report Status PENDING  Incomplete  Culture, blood (Routine x 2)     Status: None (Preliminary result)   Collection Time: Mar 05, 2018  2:24 PM  Result Value Ref Range Status   Specimen Description LEFT ANTECUBITAL  Final   Special Requests   Final    BOTTLES DRAWN AEROBIC ONLY Blood Culture adequate volume   Culture   Final    NO GROWTH < 24 HOURS Performed at [facility], [address], [place], [place] [address]    Report Status PENDING  Incomplete  MRSA PCR Screening     Status: None   Collection Time: 2018-03-05  8:42 PM  Result Value Ref Range Status   MRSA by PCR NEGATIVE NEGATIVE Final    Comment:        The GeneXpert MRSA Assay (FDA approved for NASAL specimens only), is one component of a comprehensive MRSA colonization surveillance program. It is not intended to diagnose MRSA infection nor to guide or monitor treatment for MRSA infections. Performed at [facility], [address] [place], [place] [address]     Lab Basic Metabolic Panel: Recent Labs  Lab Mar 05, 2018 1423 March 05, 2018 2112 02/28/2018 0151 02/09/2018 1117  NA 128*  --  125* 129*  K 4.4  --  4.7 4.9  CL 93*  --  94* 96*  CO2 14*  --  12* 12*  GLUCOSE 101*  --  80 71  BUN 69*  --  68* 67*  CREATININE 2.92*  --  3.02* 3.15*  CALCIUM 8.5*  --  8.2* 8.1*  MG  --  2.5*  --  2.4   Liver Function Tests: Recent Labs  Lab 03-05-18 1423 02/13/2018 0151 02/15/2018 1117  AST 133* 251* 355*  ALT 74* 137* 173*  ALKPHOS 105 107 87  BILITOT 4.5* 4.2* 3.9*  PROT 6.7 6.7 6.2*  ALBUMIN 3.0* 3.0* 3.4*   No results for input(s): LIPASE, AMYLASE in the last 168 hours. No results for input(s):  AMMONIA in the last 168 hours. CBC: Recent Labs  Lab 02-26-2018 1423 02/10/2018 0151  WBC 14.3* 16.3*  NEUTROABS 11.8*  --   HGB 12.5 13.3  HCT 41.4 45.1  MCV 85.4 86.6  PLT 340 362   Cardiac Enzymes: Recent Labs  Lab 2018-02-26 2112 02/06/2018 0151 02/17/2018 0832  TROPONINI 0.05* 0.05*  0.07*   Sepsis Labs: Recent Labs  Lab 2018-02-26 1423 26-Feb-2018 1630 02/24/2018 0151 02/16/2018 0415 02/16/2018 0832  PROCALCITON 0.35  --  0.50  --  0.45  WBC 14.3*  --  16.3*  --   --   LATICACIDVEN  --  2.3* 5.9* 6.0* 7.3*    Procedures/Operations  Central line--02/11/2018   Onalee Hua Daniell Paradise 02/24/2018, 7:24 PM

## 2018-03-03 NOTE — Progress Notes (Signed)
Patient's BP difficult to obtain via cuff and manual auscultation due to atrial fibrillation and weak pulses. Dr. Arbutus Leas aware, continuing titration up of Neo drip. Patient had critical values this morning of Troponin 0.07 and Lactic Acid of 7.3. Dr. Arbutus Leas notified of these values. Patient's daughter at the bedside and updated on severity of patient's condition. Will continue to monitor.

## 2018-03-03 NOTE — Progress Notes (Signed)
  The patient's heart rate is too high to perform the echo   Nina Lowery 02/15/2018, 2:37 PM

## 2018-03-03 NOTE — Progress Notes (Signed)
Spoke with Dr. Arbutus Leas. Patient has a grave prognosis. Anticipated possible code shortly versus DNR; he will speak with the family. We will hold off on seeing the patient.  Please contact us if needed further.  Thank you for allowing [place] to participate in the care of Nina Ormond, DNP, AGNP-C Adult & Gerontological Nurse Practitioner [facility]

## 2018-03-03 NOTE — Progress Notes (Addendum)
    Subjective: Pt opens eyes and moans.  Not answering questions on bipap  Vitals:   02/21/2018 1245 02/20/2018 1250 02/02/2018 1300 02/13/2018 1315  BP: (!) 41/19 (!) 68/52 (!) 75/51 (!) 54/33  Pulse: (!) 120 (!) 123 (!) 119   Resp: 20 20 20 20   Temp:      TempSrc:      SpO2: 100% 100% 100%   Weight:      Height:       CV--IRRR Lung--bibasilar crackles. Abd--soft+BS/NT   Assessment/Plan: Sepsis -lactic acid up to 7.3 -another 1L bolus given -1117 labs reviewed -2 additional amps bicarb given -7.21/37/46/14  (.45) -increased FiO2 to 100% -increase bicarb drip to 125cc/hr -neosynephrine increased to 200 mcg/kg/min -add levophed -personally reviewed CXR--unchanged bibasilar opacities -daughter and family updated again at bedside--explained overall poor prognosis given the patient's age and co-morbidities -they wish to continue care until pt's brother arrives  Elevated troponin -demand ischemia in setting of severe sepsis -trend is flat -unable to perform echo due to tachycardia    Catarina Hartshorn, DO [facility]

## 2018-03-03 NOTE — Progress Notes (Signed)
Responded to nursing call:  Family at bedside to discuss goals of care Family at bedside.  Updated family on pt's condition Lactate continued to rise -findings suggesting DIC and multi-organ dysfunction syndrome -pt remains intermittently agitated -abd [place] neg for acute findings and suggests possible cirrhosis -second vasopressor added  -remains hypoxic on bipap requiring 100% FiO2 -discussed overall poor prognosis and continued clinical deterioration throughout the day despite optimal therapy -daughter who is main decision maker at bedside -she states "I don't want her to suffer anymore" -daughter expressed she did not want any more xrays or blood work and wanted to transition patient's focus of care to focus on full comfort -plan to d/c drips and antibiotics and start morphine prn sob, pain    Vitals:   02-14-18 1330 02-14-2018 1345 02-14-18 1400 02/14/18 1525  BP: (!) 78/60 (!) 82/59 96/72   Pulse:    (!) 133  Resp: 18 (!) 21 20   Temp:      TempSrc:      SpO2:      Weight:      Height:        Total time 50 minutes in addtion to the 45 minutes I spent this am     Catarina Hartshorn, DO [facility]

## 2018-03-03 NOTE — Progress Notes (Signed)
Pharmacy Antibiotic Note  Nina Lowery is a 82 y.o. female admitted on 02/22/2018 with infection- unknown source.  Pharmacy has been consulted for Vancomycin and zosyn dosing. Cefepime and flagyl changed to zosyn d/t drug-drug interaction of flagyl with amiodarone and potential QT prolongation. LA elevated  Plan: Continue Vancomycin 1000 mg  IV every 48 hours.  Goal trough 15-20 mcg/mL.  Zosyn 3.375gm IV q12h EID over 4 hours Deescalate therapy as warranted Monitor labs, c/s, and vanco trough as indicated.  Height: 5\' 6"  (167.6 cm) Weight: 214 lb 8.1 oz (97.3 kg) IBW/kg (Calculated) : 59.3  Temp (24hrs), Avg:97.2 F (36.2 C), Min:96.8 F (36 C), Max:97.6 F (36.4 C)  Recent Labs  Lab 02/22/2018 1347 2018/02/22 1423 2018-02-22 1630 02/11/2018 0151 02/15/2018 0415 02/21/2018 0832  WBC  --  14.3*  --  16.3*  --   --   CREATININE  --  2.92*  --  3.02*  --   --   LATICACIDVEN 8.97*  --  2.3* 5.9* 6.0* 7.3*    Estimated Creatinine Clearance: 17.5 mL/min (A) (by C-G formula based on SCr of 3.02 mg/dL (H)).    Allergies  Allergen Reactions  . Shrimp [Shellfish Allergy] Anaphylaxis    Antimicrobials this admission: Vanco 11/7 >>  Cefepime 11/7 >>  Flayl 11/7 >>  Dose adjustments this admission: Vanco/Cefepime  Microbiology results: 11/7 BCx: ngtd 11/7 UCx: pending  11/7 MRSA PCR is negative   Thank you for allowing pharmacy to be a part of this patient's care.  Tera Mater 03/01/2018 10:52 AM

## 2018-03-03 NOTE — Progress Notes (Signed)
I met with the patient's daughter at the bedside.  I updated the patient's daughter regarding the patient's critical medical condition.  We discussed goals of care and advanced directives.  She wishes her mother to be a DNR.  I discussed the patient's overall poor prognosis given her age and comorbidities. I consulted general surgery, Dr. Bridges who was gracious enough to try to place a central line. -Repeat BMP later this morning -Continue to titrate Neo-Synephrine -Continue bicarbonate drip, bicarb amp given -place pt back on bipap -lasix IV x 1  DTat 

## 2018-03-03 NOTE — Progress Notes (Signed)
PROGRESS NOTE  Nina Lowery ZOX:[id number] DOB: 1936/02/09 DOA: 02/23/2018 PCP: Joette Catching, MD  Brief History:  82 year old female with a history of moderate aortic stenosis, anemia, hypertension, diastolic CHF, hyperlipidemia, anxiety presenting with altered mental status.  Because of the patient's extremis and altered mental status, she is unable to provide any significant history.  According to the patient's daughter, the patient appeared to sound confused on the telephone approximately 2 days prior to this admission.  Patient has had generalized malaise and has not been feeling well in general for at least 1 month.  However in the past week, there is been complaints of increasing shortness of breath, coughing, and fatigue.  In addition, the patient has been out of her furosemide for 4 days prior to admission.  When the patient's daughter went to check up on the patient on the morning of 02/06/2018, the patient was found lying on the ground confused.  EMS was activated.  The patient was noted to be tachycardic with heart rate in the 150s and CBG of 88 per EMS.  In the emergency department, the patient was hypotensive, hypoxic and tachycardic.  Initial work-up showed lactic acid of 8.97.  The patient was started on fluid resuscitation.  Urinalysis was negative for pyuria.  WBC was 14.3.  Chest x-ray showed small bilateral pleural effusions with right lower lobe opacity.  The patient was started on intravenous antibiotics for presumptive sepsis.  Assessment/Plan: Sepsis -Secondary to pneumonia, but suspect underlying bacteremia -UA negative for pyuria -Personally reviewed chest x-ray--increased interstitial markings, right lower lobe opacity, small bilateral pleural effusions -Procalcitonin 0.35 -Lactic acid peaked at 8.97 -Continue vancomycin, cefepime, metronidazole pending culture data -Follow blood cultures -Influenza PCR negative -Start Solu-Cortef -Continue  Neo-Synephrine for blood pressure support  Pneumonia/aspiration pneumonitis -MRSA PCR negative -Continue antibiotics empirically pending clinical progression and culture data -02/04/2018 CT chest--bilateral pleural effusion, right greater than left; debris within the bronchi in the lower lobes; bibasilar opacities, left greater than right with air bronchograms  Coagulopathy -INR 3.22 -Likely secondary to DIC secondary to sepsis -Monitor for signs of bleeding -Monitor coags  Atrial fibrillation with RVR, type unspecified -Continue amiodarone drip -Cardiology consult -TSH 1.044 -CHADSVASc = 6  Acute on chronic diastolic CHF -Echocardiogram -Judicious IV furosemide -Daily weights -Accurate I's and O's  Acute kidney injury -Secondary to sepsis and hemodynamic changes -Baseline creatinine 0.5-0.7 -Serum creatinine peaked 3.02 -Renal ultrasound -d/c fosinopril  Metabolic acidosis -Secondary to acute kidney injury, sepsis -Continue bicarbonate drip  Acute respiratory failure with hypoxia -Secondary to pulmonary edema and pneumonia -Wean oxygen for saturation greater 92% -Pulmonary hygiene -Currently stable on nasal cannula oxygen  Acute metabolic encephalopathy -Secondary to sepsis -At baseline, patient is alert and oriented x4 and able to perform all activities of daily living -Patient is at risk for hospital delirium -Remains confused and intermittently agitated -Haldol as needed agitation  Hyponatremia -Secondary to acute kidney injury and fluid overload -Start judicious diuresis  Essential hypertension -Holding lisinopril and HCTZ secondary to hypotension  Transaminasemia -Secondary to sepsis -Abdominal ultrasound -Viral hepatitis serologies  Lower extremity pain and edema -Venous duplex rule out DVT     Disposition Plan:   Critically ill--remain in ICU Family Communication:   Attempted to call daughter--could not leave VM  Consultants:   cardiology  Code Status:  FULL   DVT Prophylaxis:  Coagulopathic presently   Procedures: As Listed in Progress Note Above  Antibiotics: vanco 11/7>>> Cefepime  11/7>>> Flagyl 11/7>>>   The patient is critically ill with multiple organ systems failure and requires high complexity decision making for assessment and support, frequent evaluation and titration of therapies, application of advanced monitoring technologies and extensive interpretation of multiple databases.  Critical care time - 45 mins. In addition to that spent by Dr. Robb Matar     Subjective: Patient is currently confused.  She is unable to provide any significant history.  She denies any chest pain, abdominal pain, shortness of breath.  There is no reports of vomiting, diarrhea.  Remaining review of systems unobtainable secondary to patient's encephalopathy.  Objective: Vitals:   February 23, 2018 0430 February 23, 2018 0445 2018-02-23 0500 02-23-2018 0526  BP: (!) 76/58 (!) 68/56  (!) 82/63  Pulse: (!) 119   (!) 135  Resp: 18 17  (!) 28  Temp:      TempSrc:      SpO2: (!) 76%     Weight:   97.3 kg   Height:        Intake/Output Summary (Last 24 hours) at 2018/02/23 0723 Last data filed at 02-23-2018 0524 Gross per 24 hour  Intake 2360.38 ml  Output 350 ml  Net 2010.38 ml   Weight change:  Exam:   General:  Pt is alert, occasionally follows commands; confused  HEENT: No icterus, No thrush, No neck mass, [place]/AT  Cardiovascular: IRRR, S1/S2, no rubs, no gallops  Respiratory: Bilateral crackles.  No wheezing.  Good air movement.  Abdomen: Soft/+BS, non tender, non distended, no guarding  Extremities: 2 + LE edema, No lymphangitis, No petechiae, No rashes, no synovitis   Data Reviewed: I have personally reviewed following labs and imaging studies Basic Metabolic Panel: Recent Labs  Lab 02/27/2018 1423 02/02/2018 2112 02/23/2018 0151  NA 128*  --  125*  K 4.4  --  4.7  CL 93*  --  94*  CO2 14*  --  12*  GLUCOSE 101*  --   80  BUN 69*  --  68*  CREATININE 2.92*  --  3.02*  CALCIUM 8.5*  --  8.2*  MG  --  2.5*  --    Liver Function Tests: Recent Labs  Lab 02/17/2018 1423 02/23/18 0151  AST 133* 251*  ALT 74* 137*  ALKPHOS 105 107  BILITOT 4.5* 4.2*  PROT 6.7 6.7  ALBUMIN 3.0* 3.0*   No results for input(s): LIPASE, AMYLASE in the last 168 hours. No results for input(s): AMMONIA in the last 168 hours. Coagulation Profile: Recent Labs  Lab 02/03/2018 1423 2018-02-23 0151  INR 2.63 3.22   CBC: Recent Labs  Lab 02/14/2018 1423 February 23, 2018 0151  WBC 14.3* 16.3*  NEUTROABS 11.8*  --   HGB 12.5 13.3  HCT 41.4 45.1  MCV 85.4 86.6  PLT 340 362   Cardiac Enzymes: Recent Labs  Lab 02/13/2018 2112 Feb 23, 2018 0151  TROPONINI 0.05* 0.05*   BNP: Invalid input(s): POCBNP CBG: No results for input(s): GLUCAP in the last 168 hours. HbA1C: No results for input(s): HGBA1C in the last 72 hours. Urine analysis:    Component Value Date/Time   COLORURINE AMBER (A) 03/02/2018 1234   APPEARANCEUR HAZY (A) 02/10/2018 1234   LABSPEC 1.019 02/09/2018 1234   PHURINE 5.0 02/16/2018 1234   GLUCOSEU NEGATIVE 02/18/2018 1234   HGBUR NEGATIVE 02/13/2018 1234   BILIRUBINUR NEGATIVE 02/27/2018 1234   KETONESUR NEGATIVE 02/06/2018 1234   PROTEINUR 30 (A) 02/06/2018 1234   NITRITE NEGATIVE 02/03/2018 1234   LEUKOCYTESUR NEGATIVE 02/06/2018 1234  Sepsis Labs: @LABRCNTIP (procalcitonin:4,lacticidven:4) ) Recent Results (from the past 240 hour(s))  Culture, blood (Routine x 2)     Status: None (Preliminary result)   Collection Time: 02/05/2018  1:40 PM  Result Value Ref Range Status   Specimen Description RIGHT ANTECUBITAL  Final   Special Requests   Final    BOTTLES DRAWN AEROBIC AND ANAEROBIC Blood Culture results may not be optimal due to an excessive volume of blood received in culture bottles Performed at [facility], [address], [place], [place] [address]    Culture PENDING  Incomplete   Report Status  PENDING  Incomplete  Culture, blood (Routine x 2)     Status: None (Preliminary result)   Collection Time: 02/01/2018  2:24 PM  Result Value Ref Range Status   Specimen Description LEFT ANTECUBITAL  Final   Special Requests   Final    BOTTLES DRAWN AEROBIC ONLY Blood Culture adequate volume Performed at [facility], [address] [place], [place] [address]    Culture PENDING  Incomplete   Report Status PENDING  Incomplete  MRSA PCR Screening     Status: None   Collection Time: 02/22/2018  8:42 PM  Result Value Ref Range Status   MRSA by PCR NEGATIVE NEGATIVE Final    Comment:        The GeneXpert MRSA Assay (FDA approved for NASAL specimens only), is one component of a comprehensive MRSA colonization surveillance program. It is not intended to diagnose MRSA infection nor to guide or monitor treatment for MRSA infections. Performed at [facility], [address], [place], [place] [address]      Scheduled Meds: . hydrocortisone sod succinate (SOLU-CORTEF) inj  100 mg Intravenous Q8H  . Influenza vac split quadrivalent PF  0.5 mL Intramuscular Tomorrow-1000  . nystatin   Topical TID  . sodium bicarbonate  50 mEq Intravenous Once   Continuous Infusions: . sodium chloride Stopped (02/26/2018 0114)  . sodium chloride 100 mL/hr at February 26, 2018 0452  . amiodarone 30 mg/hr (02-26-2018 0645)  . ceFEPime (MAXIPIME) IV    . metronidazole 500 mg (02-26-2018 0603)  . phenylephrine (NEO-SYNEPHRINE) Adult infusion 20 mcg/min (2018-02-26 0655)  .  sodium bicarbonate  infusion 1000 mL 100 mL/hr at 26-Feb-2018 0649  . [START ON 02/09/2018] vancomycin      Procedures/Studies: Ct Chest Wo Contrast  Result Date: 02/01/2018 CLINICAL DATA:  Chronic shortness of breath, pleurisy, difficulty breathing EXAM: CT CHEST WITHOUT CONTRAST TECHNIQUE: Multidetector CT imaging of the chest was performed following the standard protocol without IV contrast. COMPARISON:  Portable chest x-ray of 02/07/2017 FINDINGS:  Cardiovascular: The heart is mildly enlarged and there are diffuse coronary artery calcifications present. No pericardial effusion is seen. The mid ascending thoracic aorta measures 39 mm in diameter. Mediastinum/Nodes: No mediastinal or hilar adenopathy is seen. The thyroid gland is unremarkable although not optimally visualized. The does appear to be a moderate size hiatal hernia present. Lungs/Pleura: There are bilateral pleural effusions present. Opacities are noted in both lower lobes left greater than right with air bronchograms. Some of these opacities could be due to atelectasis, but pneumonia particularly in the left lower lobe is a definite consideration. There is some debris within the bronchi to the lower lobes and aspiration would be a consideration. Upper Abdomen: On images through the upper abdomen, no significant abnormality is seen. There may be some enlargement of the left adrenal gland which is not completely imaged. Musculoskeletal: The thoracic vertebrae are in normal alignment with only  mild degenerative change. Some motion does obscure evaluation of several areas, such as the sternum on the lateral view. IMPRESSION: 1. Bilateral pleural effusions right greater than left with bilateral lower lobe atelectasis and/or pneumonia left-greater-than-right. Cannot exclude aspiration. 2. Moderate size hiatal hernia. 3. Cardiomegaly and diffuse coronary artery calcifications. Electronically Signed   By: Dwyane Dee M.D.   On: 02/21/2018 16:41   Dg Chest Port 1 View  Result Date: February 09, 2018 CLINICAL DATA:  Difficulty breathing EXAM: PORTABLE CHEST 1 VIEW COMPARISON:  02/14/2018 chest CT FINDINGS: Moderate cardiomegaly with small pleural effusions and associated atelectasis. No pneumothorax. No overt pulmonary edema. IMPRESSION: Moderate cardiomegaly with small pleural effusions and associated atelectasis. Electronically Signed   By: Deatra Robinson M.D.   On: 02-09-2018 01:41   Dg Chest Port 1  View  Result Date: 03/02/2018 CLINICAL DATA:  Respiratory distress. EXAM: PORTABLE CHEST 1 VIEW COMPARISON:  None. FINDINGS: Mild cardiomegaly is noted. No pneumothorax is noted. Mild right pleural effusion is noted with probable underlying atelectasis. Mild left basilar atelectasis may be present. Bony thorax is unremarkable. IMPRESSION: Mild right pleural effusion with probable underlying atelectasis. Probable mild left basilar atelectasis. Electronically Signed   By: Lupita Raider, M.D.   On: 02/22/2018 13:34    Catarina Hartshorn, DO  [facility] Pager [phone]  If 7PM-7AM, please contact night-coverage [email or web address] Password [email or web address] Feb 09, 2018, 7:23 AM   LOS: 1 day

## 2018-03-03 NOTE — Procedures (Signed)
Procedure Note  03/05/2018   Preoperative Diagnosis:  Severe sepsis with shock; systemic inflammatory response from infection with acute organ failure, acute renal failure    Postoperative Diagnosis: Same   Procedure(s) Performed: Central Line placement, right femoral with ultrasound    Surgeon: Leatrice Jewels. Henreitta Leber, MD   Assistants: None   Anesthesia: 1% lidocaine   Complications: None    Indications: Ms. Mulnix is a 82 y.o. with septic shock and organ failure who is one high dose pressures and is continuing to need support. She has a PNA with other sources of septic shock being investigated, and shows signs of organ failure including liver and renal failure.  Given the liver shock she is showing signs of coagulopathy with INR 3+.   I discussed the risk and benefits of placement of the central line with the daughter, including but not limited to bleeding, infection, and given her coagulopathy the risk of significant bleeding, risk of arterial injury, and she has given verbal consent for the procedure.    Procedure: The patient placed supine. The ultrasound was used to assess the left and right femoral veins to determine which was more appropriate. The right femoral vein was easier to identify and was about 3.5cm deep due to the patient's body habitus and swelling.  The right groin and inner thigh/ leg was prepped and draped in the usual sterile fashion.  Wearing full gown and gloves, I performed the procedure.  One percent lidocaine was used for local anesthesia. An ultrasound was utilized to assess the right femoral vein.  The needle with syringe was advanced into the femoral vein with dark venous return, and a wire was placed using the Seldinger technique without difficulty.  The [place] was used to confirm that the wire had been in the  Vein and that the catheter entered the vein. The skin was knicked and a dilator was placed, and the three lumen catheter was placed over the wire with continued  control of the wire.  There was good draw back of blood from all three lumens and each flushed easily with saline.  The catheter was secured in 2 points with 2-0 silk and a biopatch and dressing was placed.     The patient tolerated the procedure well.   Algis Greenhouse, MD [facility] [address] [place], [place] [address] [phone] (office)

## 2018-03-03 NOTE — Progress Notes (Signed)
ICU/SDU night shift coverage note.    Nina Lowery  RUE:[id number] DOB: November 15, 1935 DOA: 28-Feb-2018 PCP: Joette Catching, MD   Brief Narrative:  The patient was seen due to hypotension, tachycardia, restlessness and decreased urine output.  Her chart was reviewed when she was examined.  She has multiorgan failure/dysfunction in the setting of sepsis.  She complained of being tired and wanting to go to sleep.  She denied headache, chest pain or abdominal pain at the time of my examination.  Assessment & Plan:   Principal Problem:   Sepsis with acute hypoxic respiratory failure (HCC) Infiltrates and/or atelectasis on chest radiograph. The patient has been hypoxic so this could be the potential source. Continue supplemental oxygen and BiPAP ventilation. She was given 0.5 mg of Ativan IVP to help her sleep. Continue IV fluids. Continue IV antibiotics. Follow-up blood culture and sensitivity.  Active Problems:   AKI (acute kidney injury) (HCC) Likely due to volume depletion, sepsis, diuretics and ACE inhibitor use. Diuretics and ACE inhibitor have been held. Continue sepsis treatment. Continue IV fluids. Albumin 50 g IVPB given. Monitor intake and output. Follow-up renal function electrolytes. Consult nephrology.    Atrial fibrillation with [facility]) Age, female, hypertension and coronary calcifications. CHA?DS?-VASc Score of at least 5. Likely exacerbated by sepsis and volume depletion. Continue amiodarone infusion. Hypotension precludes use of beta or calcium channel blockers. A single dose of digoxin was given due to hypotension. Consider low-dose metoprolol tartrate IV if BP improves.    Lactic acidosis Normal saline 2 L bolus has been given    Elevated liver enzymes   Hyperbilirubinemia Her past surgical history mention gallbladder surgery. I will obtain a RUQ ultrasound later today. Follow-up bilirubin and liver enzymes. Consult gastroenterology for further  evaluation.    Elevated brain natriuretic peptide (BNP) level I believe that this is due to AKI since there is no crackles on physical exam and her chest radiograph does not showed interstitial edema.  Echocardiogram was ordered.  Prognosis: Given her multiorgan involvement/dysfunction/failure along with her advanced age, her prognosis is very poor.  She wishes to be in FULL CODE status   Objective: Vitals:   03/01/2018 0200 02/09/2018 0215 02/01/2018 0230 02/21/2018 0247  BP: (!) 86/53 (!) 101/52 (!) 101/21 (!) 84/69  Pulse:    (!) 123  Resp: (!) 24 (!) 25 (!) 26 (!) 21  Temp:      TempSrc:      SpO2:    94%  Weight:      Height:        Intake/Output Summary (Last 24 hours) at 02/25/2018 0311 Last data filed at 03/01/2018 0158 Gross per 24 hour  Intake 2360.38 ml  Output -  Net 2360.38 ml   Filed Weights   2018-02-28 1430 02-28-2018 2052  Weight: 86.6 kg 97.3 kg    Examination:  General exam: Appears restless and anxious. Respiratory system: Decreased breath sounds on bases, otherwise clear to auscultation. Respiratory effort is mostly normal, but occasionally the patient will get mildly tachypneic in the low 20s. Cardiovascular system: Tachycardic.  Irregularly irregular in the 120s to 140s. No JVD, murmurs, rubs, gallops or clicks.  3+ lower extremities pitting edema. Gastrointestinal system: Abdomen is nondistended, soft and nontender. No organomegaly or masses felt. Normal bowel sounds heard. Central nervous system: Alert and oriented x2, partially oriented to time and situation. No gross focal neurological deficits on limited examination. Extremities: No muscle atrophy.  Generalized muscle weakness. Skin: Positive cyanosis of fingers.  Positive onychomycosis and cyanosis of toes.  Lower extremities have dressings from transudative pretibial wounds. Psychiatry: Anxious, mildly confused, but follows commands and answers simple questions.    Data Reviewed: I have personally  reviewed following labs and imaging studies  CBC: Recent Labs  Lab February 27, 2018 1423 02/17/2018 0151  WBC 14.3* 16.3*  NEUTROABS 11.8*  --   HGB 12.5 13.3  HCT 41.4 45.1  MCV 85.4 86.6  PLT 340 362   Basic Metabolic Panel: Recent Labs  Lab 27-Feb-2018 1423 2018-02-27 2112 02/18/2018 0151  NA 128*  --  125*  K 4.4  --  4.7  CL 93*  --  94*  CO2 14*  --  12*  GLUCOSE 101*  --  80  BUN 69*  --  68*  CREATININE 2.92*  --  3.02*  CALCIUM 8.5*  --  8.2*  MG  --  2.5*  --    GFR: Estimated Creatinine Clearance: 17.5 mL/min (A) (by C-G formula based on SCr of 3.02 mg/dL (H)). Liver Function Tests: Recent Labs  Lab 02-27-18 1423 02/05/2018 0151  AST 133* 251*  ALT 74* 137*  ALKPHOS 105 107  BILITOT 4.5* 4.2*  PROT 6.7 6.7  ALBUMIN 3.0* 3.0*   No results for input(s): LIPASE, AMYLASE in the last 168 hours. No results for input(s): AMMONIA in the last 168 hours. Coagulation Profile: Recent Labs  Lab 02/27/2018 1423 02/18/2018 0151  INR 2.63 3.22   Cardiac Enzymes: Recent Labs  Lab February 27, 2018 2112 02/24/2018 0151  TROPONINI 0.05* 0.05*   BNP (last 3 results) No results for input(s): PROBNP in the last 8760 hours. HbA1C: No results for input(s): HGBA1C in the last 72 hours. CBG: No results for input(s): GLUCAP in the last 168 hours. Lipid Profile: No results for input(s): CHOL, HDL, LDLCALC, TRIG, CHOLHDL, LDLDIRECT in the last 72 hours. Thyroid Function Tests: Recent Labs    02/27/2018 2112  TSH 1.044   Anemia Panel: No results for input(s): VITAMINB12, FOLATE, FERRITIN, TIBC, IRON, RETICCTPCT in the last 72 hours. Sepsis Labs: Recent Labs  Lab 02-27-18 1347 02-27-2018 1423 02/27/18 1630 02/23/2018 0151  PROCALCITON  --  0.35  --  0.50  LATICACIDVEN 8.97*  --  2.3* 5.9*    Recent Results (from the past 240 hour(s))  Culture, blood (Routine x 2)     Status: None (Preliminary result)   Collection Time: Feb 27, 2018  1:40 PM  Result Value Ref Range Status   Specimen  Description RIGHT ANTECUBITAL  Final   Special Requests   Final    BOTTLES DRAWN AEROBIC AND ANAEROBIC Blood Culture results may not be optimal due to an excessive volume of blood received in culture bottles Performed at [facility], [address], [place], [place] [address]    Culture PENDING  Incomplete   Report Status PENDING  Incomplete  Culture, blood (Routine x 2)     Status: None (Preliminary result)   Collection Time: Feb 27, 2018  2:24 PM  Result Value Ref Range Status   Specimen Description LEFT ANTECUBITAL  Final   Special Requests   Final    BOTTLES DRAWN AEROBIC ONLY Blood Culture adequate volume Performed at [facility], [address] [place], [place] [address]    Culture PENDING  Incomplete   Report Status PENDING  Incomplete  MRSA PCR Screening     Status: None   Collection Time: 02/27/18  8:42 PM  Result Value Ref Range Status   MRSA by PCR NEGATIVE NEGATIVE Final  Comment:        The GeneXpert MRSA Assay (FDA approved for NASAL specimens only), is one component of a comprehensive MRSA colonization surveillance program. It is not intended to diagnose MRSA infection nor to guide or monitor treatment for MRSA infections. Performed at [facility], [address], [place], [place] [address]          Radiology Studies: Ct Chest Wo Contrast  Result Date: Feb 10, 2018 CLINICAL DATA:  Chronic shortness of breath, pleurisy, difficulty breathing EXAM: CT CHEST WITHOUT CONTRAST TECHNIQUE: Multidetector CT imaging of the chest was performed following the standard protocol without IV contrast. COMPARISON:  Portable chest x-ray of 02/07/2017 FINDINGS: Cardiovascular: The heart is mildly enlarged and there are diffuse coronary artery calcifications present. No pericardial effusion is seen. The mid ascending thoracic aorta measures 39 mm in diameter. Mediastinum/Nodes: No mediastinal or hilar adenopathy is seen. The thyroid gland is unremarkable although not optimally  visualized. The does appear to be a moderate size hiatal hernia present. Lungs/Pleura: There are bilateral pleural effusions present. Opacities are noted in both lower lobes left greater than right with air bronchograms. Some of these opacities could be due to atelectasis, but pneumonia particularly in the left lower lobe is a definite consideration. There is some debris within the bronchi to the lower lobes and aspiration would be a consideration. Upper Abdomen: On images through the upper abdomen, no significant abnormality is seen. There may be some enlargement of the left adrenal gland which is not completely imaged. Musculoskeletal: The thoracic vertebrae are in normal alignment with only mild degenerative change. Some motion does obscure evaluation of several areas, such as the sternum on the lateral view. IMPRESSION: 1. Bilateral pleural effusions right greater than left with bilateral lower lobe atelectasis and/or pneumonia left-greater-than-right. Cannot exclude aspiration. 2. Moderate size hiatal hernia. 3. Cardiomegaly and diffuse coronary artery calcifications. Electronically Signed   By: Dwyane Dee M.D.   On: 10-Feb-2018 16:41   Dg Chest Port 1 View  Result Date: 02/17/2018 CLINICAL DATA:  Difficulty breathing EXAM: PORTABLE CHEST 1 VIEW COMPARISON:  2018-02-10 chest CT FINDINGS: Moderate cardiomegaly with small pleural effusions and associated atelectasis. No pneumothorax. No overt pulmonary edema. IMPRESSION: Moderate cardiomegaly with small pleural effusions and associated atelectasis. Electronically Signed   By: Deatra Robinson M.D.   On: 02/26/2018 01:41   Dg Chest Port 1 View  Result Date: February 10, 2018 CLINICAL DATA:  Respiratory distress. EXAM: PORTABLE CHEST 1 VIEW COMPARISON:  None. FINDINGS: Mild cardiomegaly is noted. No pneumothorax is noted. Mild right pleural effusion is noted with probable underlying atelectasis. Mild left basilar atelectasis may be present. Bony thorax is  unremarkable. IMPRESSION: Mild right pleural effusion with probable underlying atelectasis. Probable mild left basilar atelectasis. Electronically Signed   By: Lupita Raider, M.D.   On: 10-Feb-2018 13:34    Scheduled Meds: . Influenza vac split quadrivalent PF  0.5 mL Intramuscular Tomorrow-1000  . nystatin   Topical TID   Continuous Infusions: . sodium chloride Stopped (02/12/2018 0114)  . amiodarone 30 mg/hr (02/26/2018 0158)  . ceFEPime (MAXIPIME) IV    . metronidazole Stopped (February 10, 2018 2206)  . sodium chloride 1,000 mL (02/26/2018 0234)  . [START ON 02/09/2018] vancomycin       LOS: 1 day   Time spent: Over 120 minutes of critical care time were used during this emergent event.  Bobette Mo, MD [facility] Pager [phone].  If 7PM-7AM, please contact night-coverage [email or web address] Password [email or web address] 02/02/2018, 3:11 AM

## 2018-03-03 NOTE — Progress Notes (Signed)
Reached out to [facility] and spoke with [employer]. Patient ruled out for donation due to sepsis diagnosis.

## 2018-03-03 DEATH — deceased

## 2018-03-04 ENCOUNTER — Ambulatory Visit: Payer: Medicare Other | Admitting: Cardiovascular Disease

## 2020-05-09 IMAGING — US VENOUS DOPPLER ULTRASOUND OF BILATERAL LOWER EXTREMITIES
1 series · 13 of 24 positions shown · non-contrast
Comparison: None.

CLINICAL DATA: Bilateral lower extremity pain and edema. Shortness
of breath for the past week. Evaluate for DVT.



[Series 1: venous doppler ultrasound of bilateral lower extre · 13 of 54 slices shown]
[im 1/54]
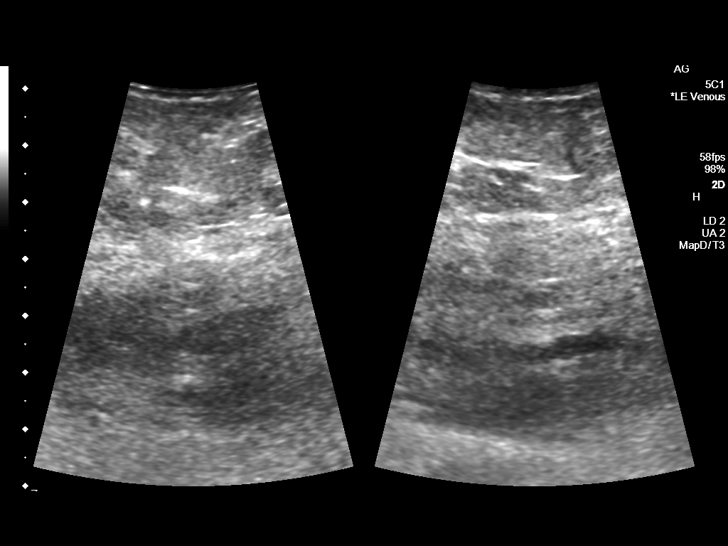
[im 5/54]
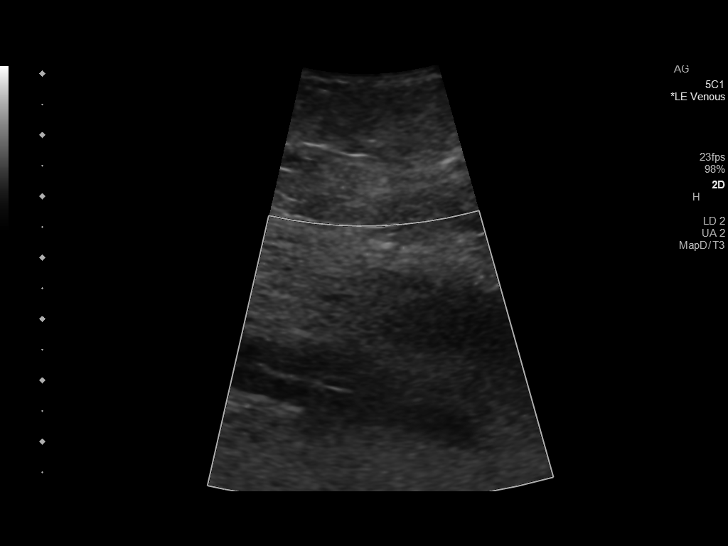
[im 10/54]
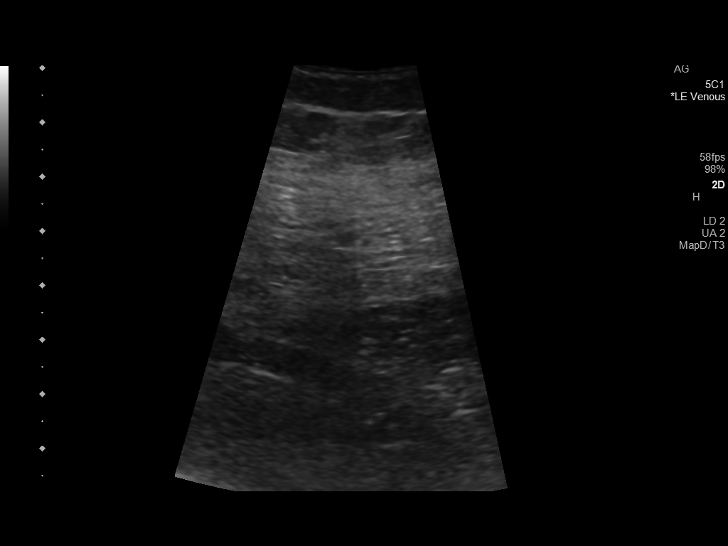
[im 14/54]
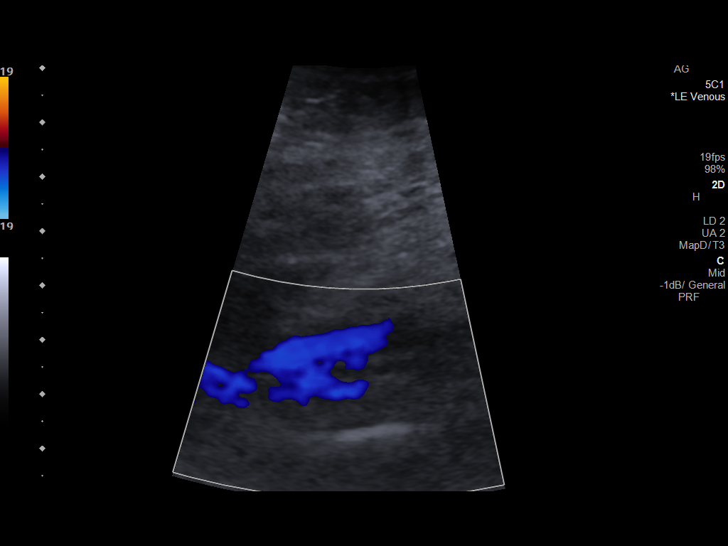
[im 19/54]
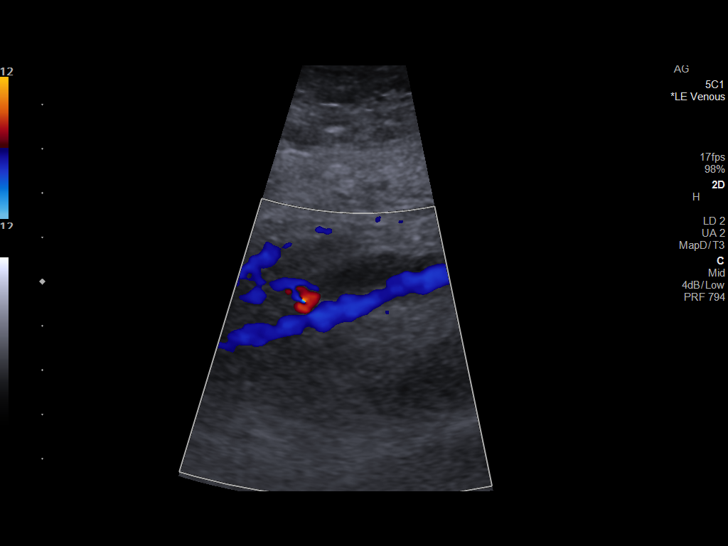
[im 24/54]
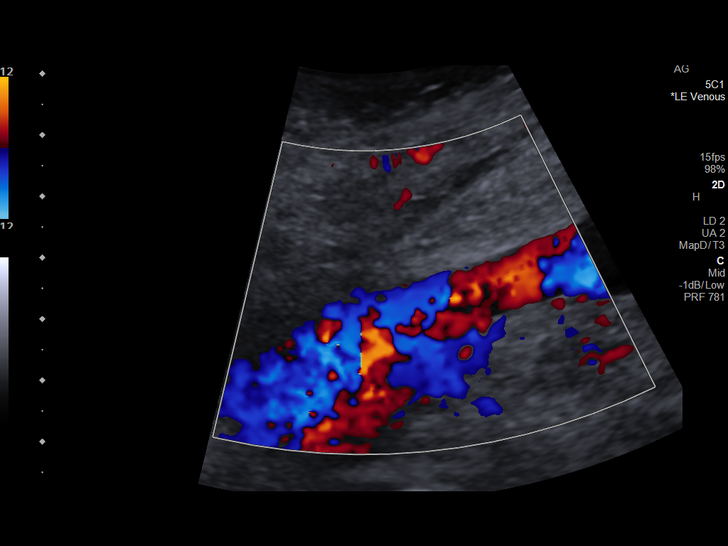
[im 28/54]
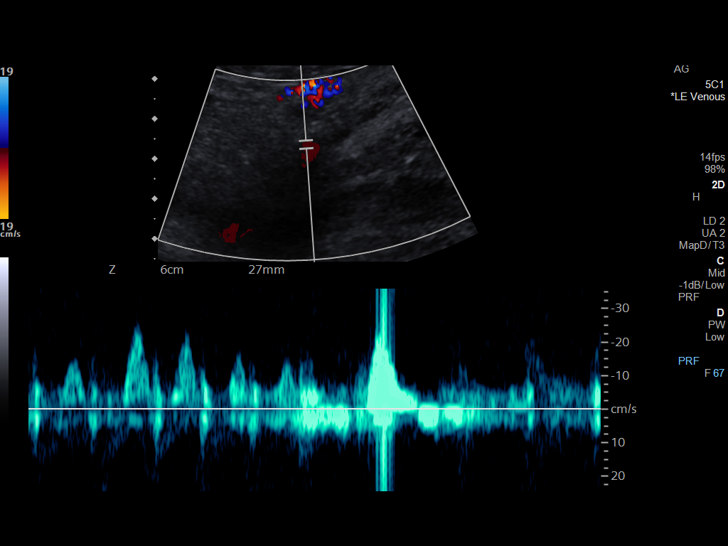
[im 30/54]
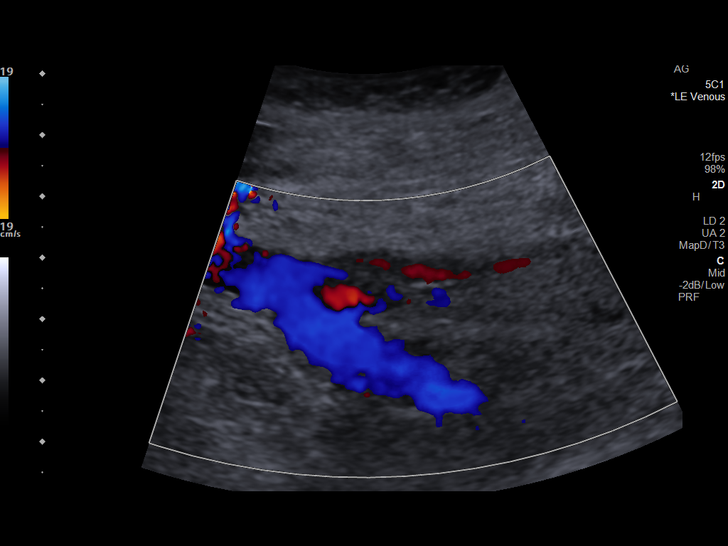
[im 35/54]
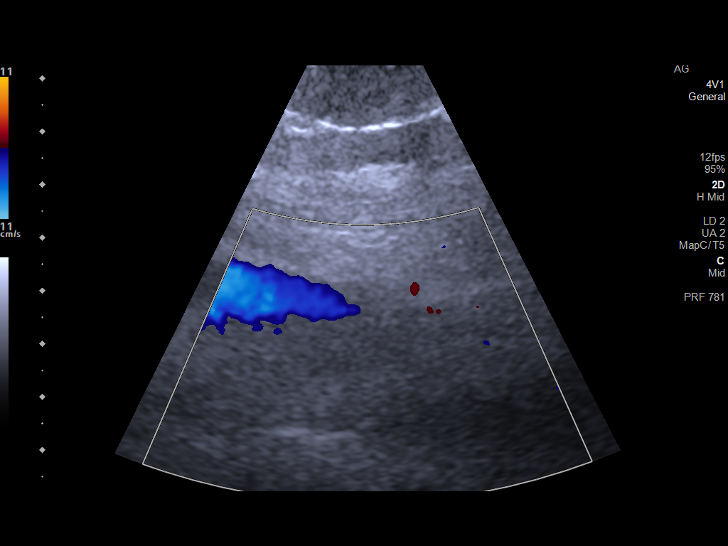
[im 40/54]
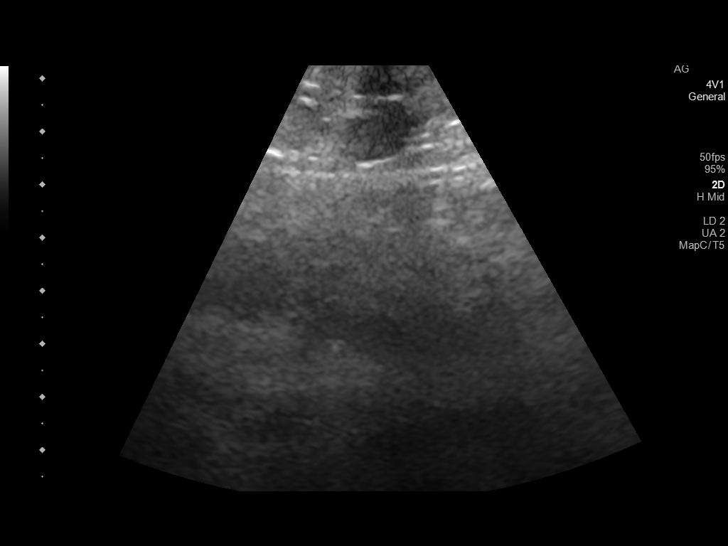
[im 44/54]
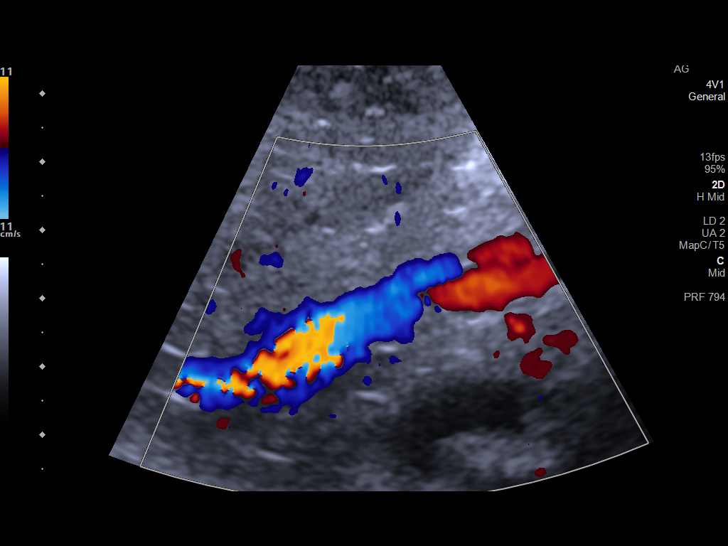
[im 49/54]
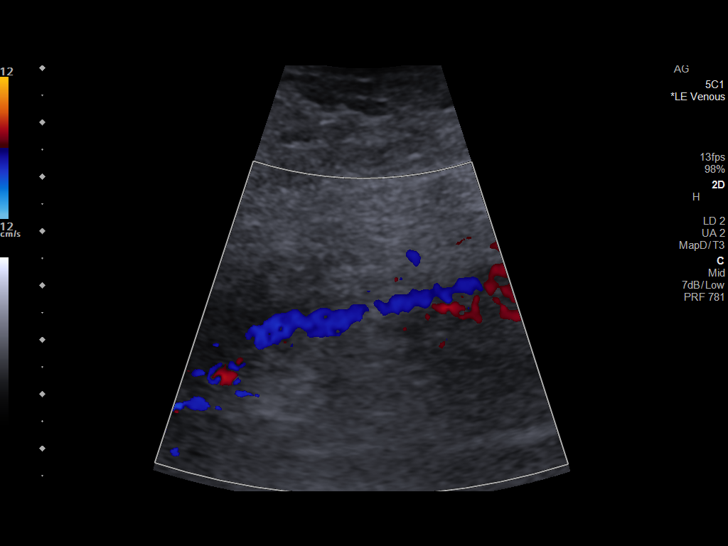
[im 54/54]
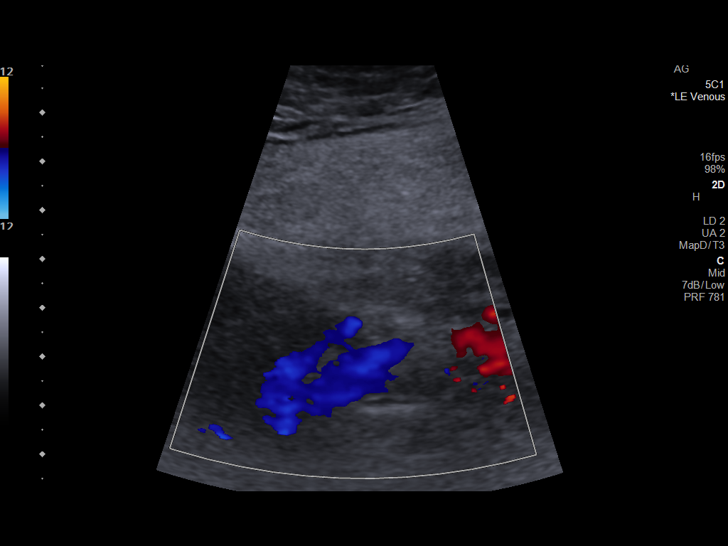

[13 of 24 positions shown; findings below may reference images not displayed]

FINDINGS: Examination is degraded due to patient body habitus and poor
sonographic window.

RIGHT LOWER EXTREMITY

Common Femoral Vein, saphenofemoral junction and profundus femoral
vein: Not evaluated secondary to presence of a right femoral
approach central venous catheter.

Femoral Vein: The proximal aspect the right femoral vein was not
evaluated secondary to presence of a right femoral approach central
venous catheter. No evidence of thrombus within the mid and distal
aspects of the right femoral vein. Normal compressibility,
respiratory phasicity and response to augmentation.

Popliteal Vein: No evidence of thrombus. Normal compressibility,
respiratory phasicity and response to augmentation.

Calf Veins: Appear patent where imaged.

Superficial Great Saphenous Vein: No evidence of thrombus. Normal
compressibility.

Other Findings:  None.

LEFT LOWER EXTREMITY

Common Femoral Vein: No evidence of thrombus. Normal
compressibility, respiratory phasicity and response to augmentation.

Saphenofemoral Junction: No evidence of thrombus. Normal
compressibility and flow on color Doppler imaging.

Profunda Femoral Vein: No evidence of thrombus. Normal
compressibility and flow on color Doppler imaging.

Femoral Vein: No evidence of thrombus. Normal compressibility,
respiratory phasicity and response to augmentation.

Popliteal Vein: No evidence of thrombus. Normal compressibility,
respiratory phasicity and response to augmentation.

Calf Veins: Appear patent where imaged.

Superficial Great Saphenous Vein: No evidence of thrombus. Normal
compressibility.

Other Findings:  None.
IMPRESSION: No evidence of DVT within either lower extremity on this body
habitus degraded examination.

## 2020-05-09 IMAGING — US US ABDOMEN COMPLETE
1 series · 14 of 25 positions shown · non-contrast
Comparison: None.

CLINICAL DATA: 82 y/o F; multiorgan failure sepsis. Altered mental
status.

EXAM:
ABDOMEN ULTRASOUND COMPLETE

[Series 1: us abdomen complete · 14 of 79 slices shown]
[im 1/79]
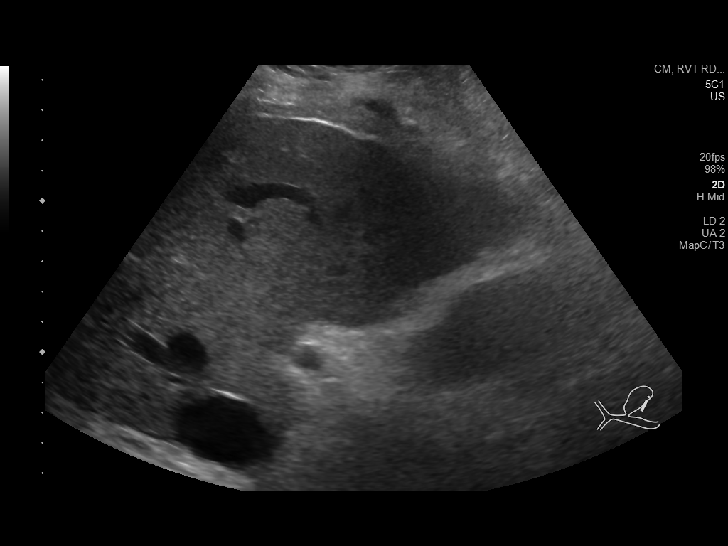
[im 7/79]
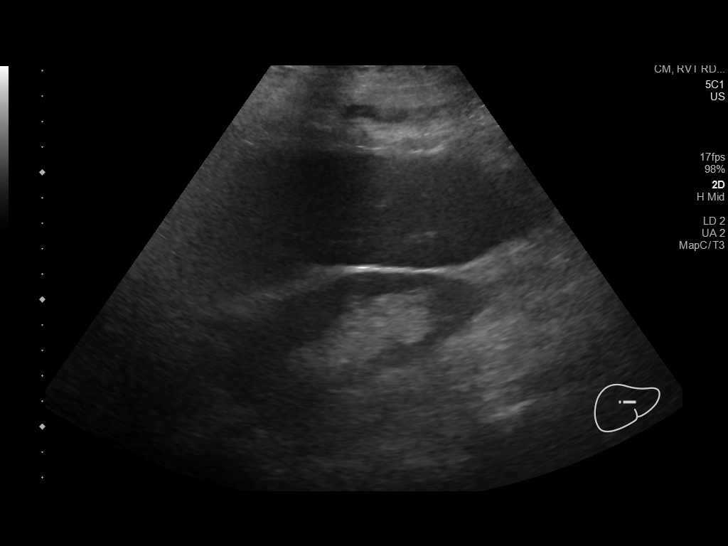
[im 14/79]
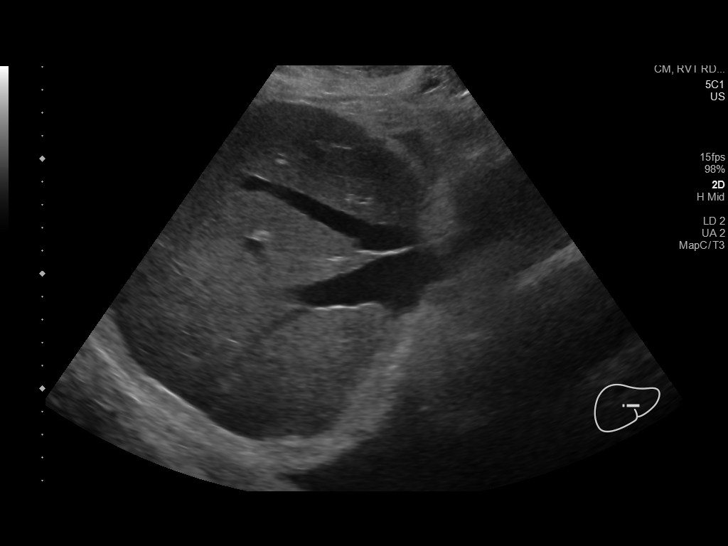
[im 20/79]
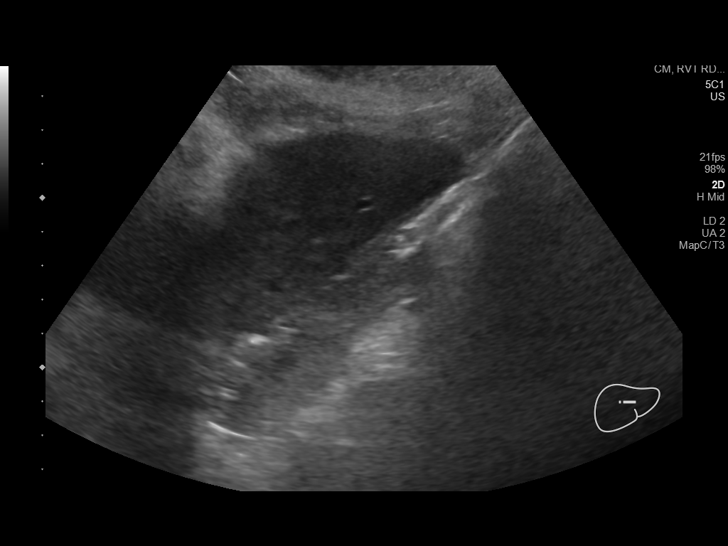
[im 27/79]
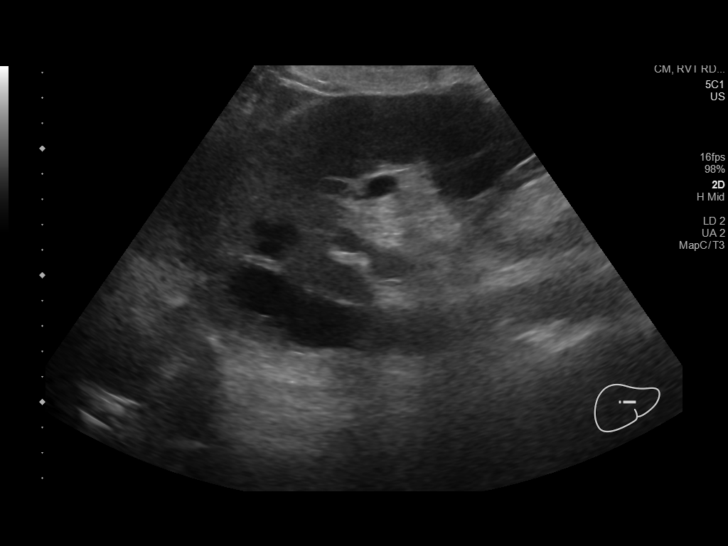
[im 30/79]
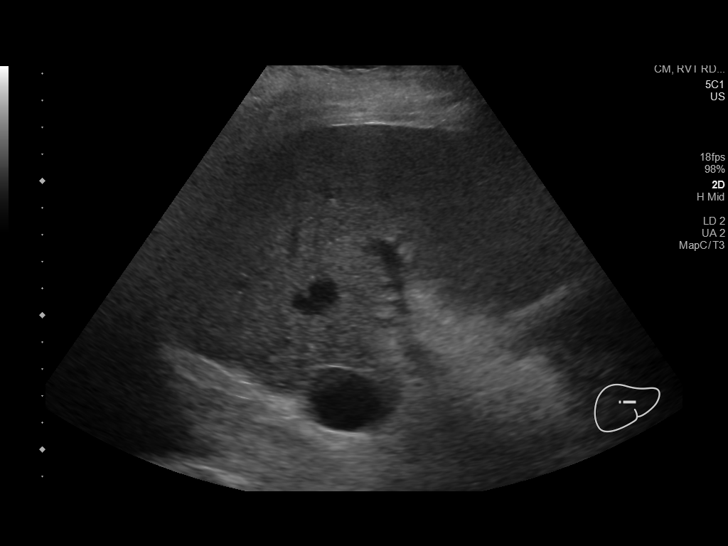
[im 36/79]
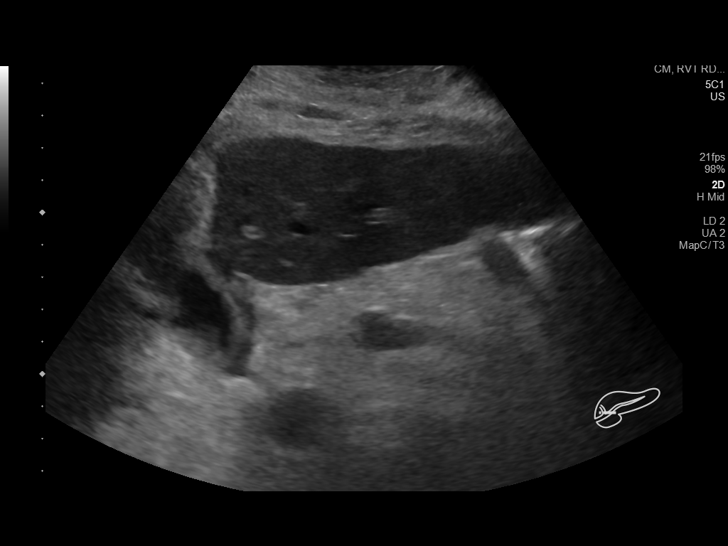
[im 43/79]
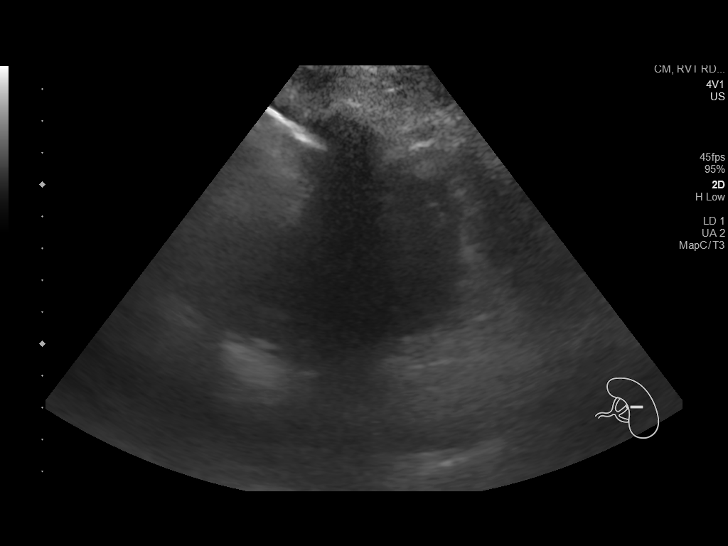
[im 49/79]
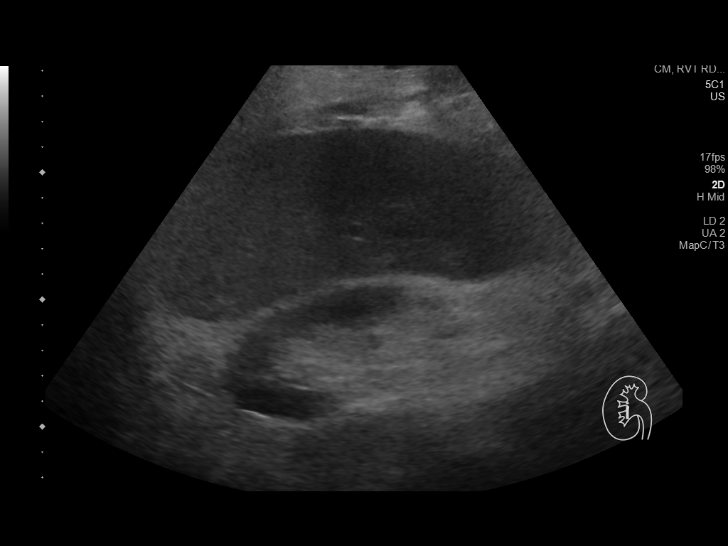
[im 53/79]
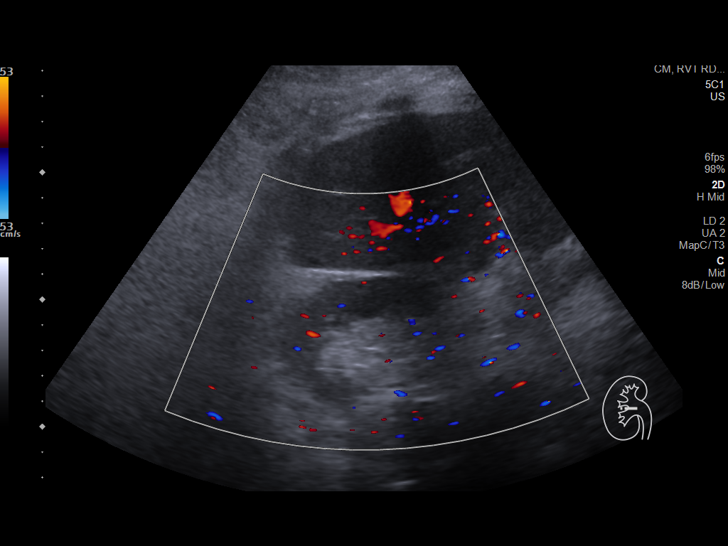
[im 59/79]
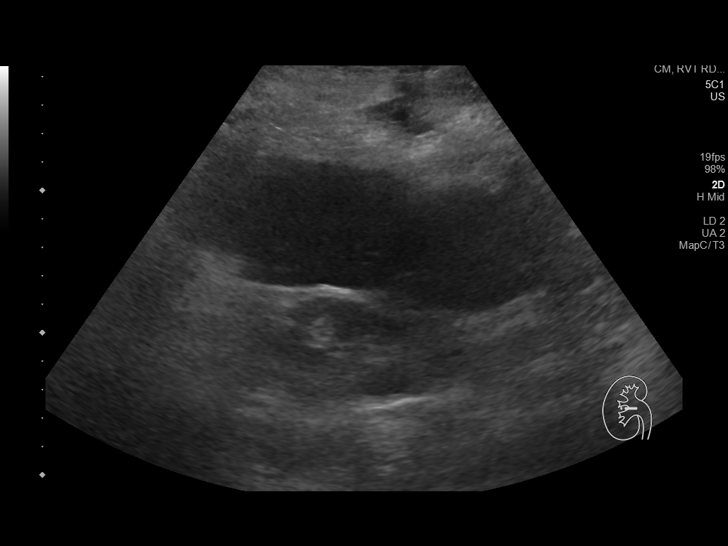
[im 66/79]
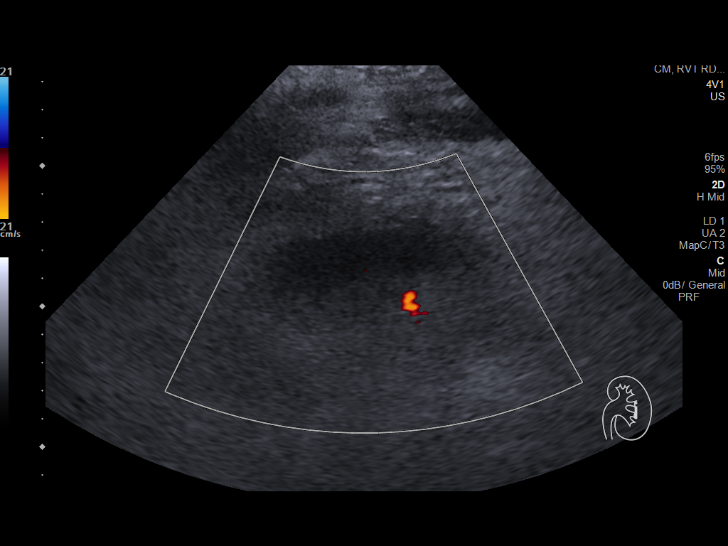
[im 72/79]
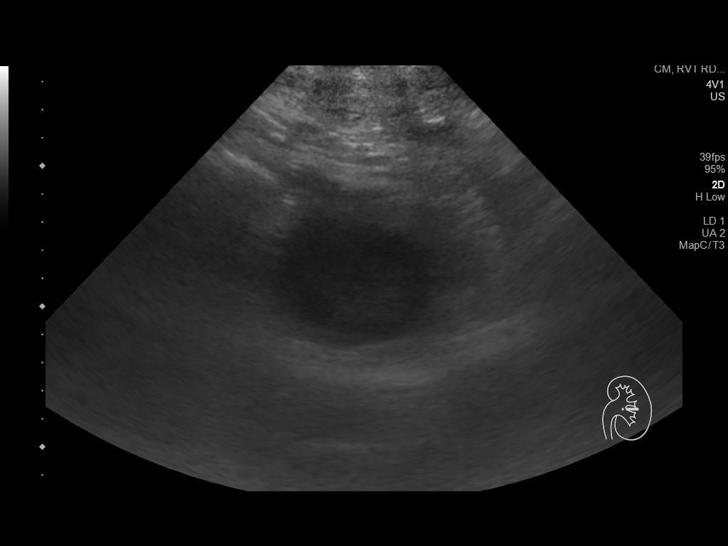
[im 79/79]
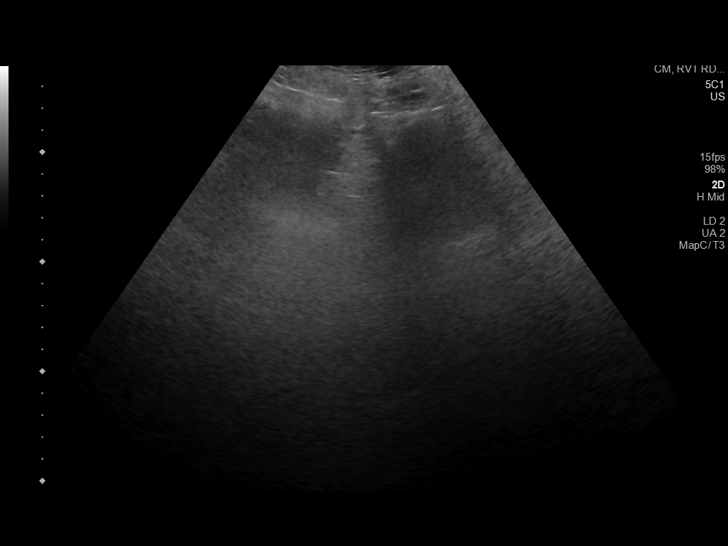

[14 of 25 positions shown; findings below may reference images not displayed]

FINDINGS: Gallbladder: Cholecystectomy.

Common bile duct: Diameter: 7 mm

Liver: No focal lesion identified. Normal liver echogenicity. Mild
lobulation of liver contour. Portal vein is patent on color Doppler
imaging with normal direction of blood flow towards the liver.

IVC: No abnormality visualized.

Pancreas: Visualized portion unremarkable.

Spleen: Size and appearance within normal limits.

Right Kidney: Length: 11.6 cm. Echogenicity within normal limits. No
hydronephrosis. 1.9 cm hypoechoic focus within the lower pole of the
kidney contiguous with the medullary fat.

Left Kidney: Length: 12.4 cm. Incompletely visualized. Echogenicity
within normal limits. No mass or hydronephrosis visualized.

Abdominal aorta: Largely obscured by bowel gas. Visible segments are
normal in caliber.

Other findings: None.
IMPRESSION: 1. No acute process identified.
2. Mild lobulation of liver contour may reflect cirrhosis.
3. 19 mm echogenic focus in right kidney lower pole, possibly
angiomyolipoma or a prominent medullary column.
